# Patient Record
Sex: Female | Born: 1985 | Race: Black or African American | Hispanic: No | Marital: Single | Smoking: Current some day smoker
Health system: Southern US, Community
[De-identification: ages and names within clinical notes are randomized; demographics above are authoritative.]

## PROBLEM LIST (undated history)

## (undated) DIAGNOSIS — J020 Streptococcal pharyngitis: Secondary | ICD-10-CM

## (undated) DIAGNOSIS — M722 Plantar fascial fibromatosis: Secondary | ICD-10-CM

## (undated) HISTORY — PX: TUBAL LIGATION: SHX77

## (undated) HISTORY — PX: HERNIA REPAIR: SHX51

---

## 2008-10-12 ENCOUNTER — Inpatient Hospital Stay (HOSPITAL_COMMUNITY): Admission: AD | Admit: 2008-10-12 | Discharge: 2008-10-13 | Payer: Self-pay | Admitting: Obstetrics & Gynecology

## 2010-07-12 LAB — CBC
MCHC: 34.3 g/dL (ref 30.0–36.0)
Platelets: 246 10*3/uL (ref 150–400)
RBC: 4.01 MIL/uL (ref 3.87–5.11)
WBC: 12.4 10*3/uL — ABNORMAL HIGH (ref 4.0–10.5)

## 2010-07-12 LAB — URINALYSIS, ROUTINE W REFLEX MICROSCOPIC
Glucose, UA: NEGATIVE mg/dL
Nitrite: NEGATIVE
Protein, ur: NEGATIVE mg/dL

## 2012-05-03 ENCOUNTER — Encounter (HOSPITAL_BASED_OUTPATIENT_CLINIC_OR_DEPARTMENT_OTHER): Payer: Self-pay | Admitting: *Deleted

## 2012-05-03 ENCOUNTER — Emergency Department (HOSPITAL_BASED_OUTPATIENT_CLINIC_OR_DEPARTMENT_OTHER)
Admission: EM | Admit: 2012-05-03 | Discharge: 2012-05-03 | Disposition: A | Discharge status: Home / Self Care | Payer: BC Managed Care – PPO | Attending: Emergency Medicine | Admitting: Emergency Medicine

## 2012-05-03 DIAGNOSIS — O210 Mild hyperemesis gravidarum: Secondary | ICD-10-CM | POA: Insufficient documentation

## 2012-05-03 DIAGNOSIS — O219 Vomiting of pregnancy, unspecified: Secondary | ICD-10-CM

## 2012-05-03 DIAGNOSIS — O9989 Other specified diseases and conditions complicating pregnancy, childbirth and the puerperium: Secondary | ICD-10-CM | POA: Insufficient documentation

## 2012-05-03 DIAGNOSIS — R110 Nausea: Secondary | ICD-10-CM | POA: Insufficient documentation

## 2012-05-03 LAB — CBC WITH DIFFERENTIAL/PLATELET
Basophils Absolute: 0 10*3/uL (ref 0.0–0.1)
Basophils Relative: 0 % (ref 0–1)
HCT: 36.6 % (ref 36.0–46.0)
Lymphocytes Relative: 17 % (ref 12–46)
Monocytes Absolute: 0.8 10*3/uL (ref 0.1–1.0)
Neutro Abs: 12.8 10*3/uL — ABNORMAL HIGH (ref 1.7–7.7)
Neutrophils Relative %: 78 % — ABNORMAL HIGH (ref 43–77)
Platelets: 323 10*3/uL (ref 150–400)
RDW: 12.6 % (ref 11.5–15.5)
WBC: 16.5 10*3/uL — ABNORMAL HIGH (ref 4.0–10.5)

## 2012-05-03 LAB — BASIC METABOLIC PANEL
CO2: 25 mEq/L (ref 19–32)
Chloride: 101 mEq/L (ref 96–112)
GFR calc Af Amer: >90 mL/min (ref >90)
Potassium: 3.8 mEq/L (ref 3.5–5.1)
Sodium: 137 mEq/L (ref 135–145)

## 2012-05-03 MED ORDER — SODIUM CHLORIDE 0.9 % IV BOLUS (SEPSIS)
1000.0000 mL | Freq: Once | INTRAVENOUS | Status: AC
  Administered 2012-05-03: 1000 mL via INTRAVENOUS

## 2012-05-03 MED ORDER — ONDANSETRON 4 MG PO TBDP: 4.0000 mg | ORAL_TABLET | Freq: Three times a day (TID) | ORAL | Status: DC | PRN

## 2012-05-03 MED ORDER — ONDANSETRON HCL 4 MG/2ML IJ SOLN
4.0000 mg | Freq: Once | INTRAMUSCULAR | Status: AC
  Administered 2012-05-03: 4 mg via INTRAVENOUS
  Filled 2012-05-03: qty 2

## 2012-05-03 NOTE — ED Provider Notes (Signed)
History     CSN: [id number]  Arrival date & time 05/03/12  1910   First MD Initiated Contact with Patient 05/03/12 1926      Chief Complaint  Patient presents with  . Emesis During Pregnancy    (Consider location/radiation/quality/duration/timing/severity/associated sxs/prior treatment) HPI Comments: Patient is a 27 year old female who presents after 6 episodes of vomiting that occurred today. Patient reports being [redacted] weeks pregnant and having gradual onset of nausea and vomiting this morning. Symptoms persisted throughout the day without improvement. Patient reports taking azithromycin for recent diagnosis of pneumonia. Patient thinks the antibiotics made her sick. No aggravating/alleviating factors. No associated symptoms.    History reviewed. No pertinent past medical history.  Past Surgical History  Procedure Date  . Cesarean section     History reviewed. No pertinent family history.  History  Substance Use Topics  . Smoking status: Never Smoker   . Smokeless tobacco: Not on file  . Alcohol Use: No    OB History    Grav Para Term Preterm Abortions TAB SAB Ect Mult Living                  Review of Systems  Gastrointestinal: Positive for nausea and vomiting.  All other systems reviewed and are negative.    Allergies  Review of patient's allergies indicates no known allergies.  Home Medications   Current Outpatient Rx  Name  Route  Sig  Dispense  Refill  . AZITHROMYCIN 250 MG PO TABS   Oral   Take 250 mg by mouth daily.         Marland Kitchen METRONIDAZOLE 500 MG PO TABS   Oral   Take 500 mg by mouth 3 (three) times daily.           BP 134/84  Pulse 100  Temp 98.2 F (36.8 C) (Oral)  Resp 16  Ht 5\' 4"  (1.626 m)  Wt 200 lb (90.719 kg)  BMI 34.33 kg/m2  SpO2 100%  LMP 03/10/2012  Physical Exam  Nursing note and vitals reviewed. Constitutional: She is oriented to person, place, and time. She appears well-developed and well-nourished. No distress.   HENT:  Head: Normocephalic and atraumatic.  Eyes: Conjunctivae normal and EOM are normal.  Neck: Normal range of motion.  Cardiovascular: Normal rate and regular rhythm.  Exam reveals no gallop and no friction rub.   No murmur heard. Pulmonary/Chest: Effort normal and breath sounds normal. She has no wheezes. She has no rales. She exhibits no tenderness.  Abdominal: Soft. There is no tenderness.  Musculoskeletal: Normal range of motion.  Neurological: She is alert and oriented to person, place, and time. Coordination normal.       Speech is goal-oriented. Moves limbs without ataxia.   Skin: Skin is warm and dry.  Psychiatric: She has a normal mood and affect. Her behavior is normal.    ED Course  Procedures (including critical care time)  Labs Reviewed  CBC WITH DIFFERENTIAL - Abnormal; Notable for the following:    WBC 16.5 (*)     Neutrophils Relative 78 (*)     Neutro Abs 12.8 (*)     All other components within normal limits  BASIC METABOLIC PANEL - Abnormal; Notable for the following:    Glucose, Bld 124 (*)     All other components within normal limits   No results found.   1. Nausea and vomiting in pregnancy       MDM  9:09 PM Patient  given fluids and iv zofran. Patient reports feeling better. Labs unremarkable. Patient will have crackers and juice. If she tolerates PO patient can be discharged without further evaluation.   9:29 PM Patient feeling better and can be discharged with zofran for symptoms. Patient instructed to return with worsening or concerning symptoms.       Emilia Beck, [place] 05/03/12 2146

## 2012-05-03 NOTE — ED Notes (Signed)
Pt c/o vomiting x 1 day pt reports 6 weeks preg, seen by [facility] ed x 2 days ago dx pneumonia , vomiting started after taking z pack

## 2012-05-03 NOTE — ED Notes (Signed)
Pt c/o vomiting after starting on flagyl. Pt states the sxs started today, denies any diarrhea or abd pain.

## 2012-05-03 NOTE — ED Provider Notes (Signed)
Medical screening examination/treatment/procedure(s) were performed by non-physician practitioner and as supervising physician I was immediately available for consultation/collaboration.   Charles B. Bernette Mayers, MD 05/03/12 [phone]

## 2012-09-26 DIAGNOSIS — M5412 Radiculopathy, cervical region: Secondary | ICD-10-CM | POA: Insufficient documentation

## 2012-09-26 DIAGNOSIS — Z79899 Other long term (current) drug therapy: Secondary | ICD-10-CM | POA: Insufficient documentation

## 2012-09-26 DIAGNOSIS — Z792 Long term (current) use of antibiotics: Secondary | ICD-10-CM | POA: Insufficient documentation

## 2012-09-26 DIAGNOSIS — Z3202 Encounter for pregnancy test, result negative: Secondary | ICD-10-CM | POA: Insufficient documentation

## 2012-09-27 ENCOUNTER — Emergency Department (HOSPITAL_BASED_OUTPATIENT_CLINIC_OR_DEPARTMENT_OTHER)
Admission: EM | Admit: 2012-09-27 | Discharge: 2012-09-27 | Disposition: A | Discharge status: Home / Self Care | Payer: BC Managed Care – PPO | Attending: Emergency Medicine | Admitting: Emergency Medicine

## 2012-09-27 ENCOUNTER — Emergency Department (HOSPITAL_BASED_OUTPATIENT_CLINIC_OR_DEPARTMENT_OTHER): Payer: BC Managed Care – PPO

## 2012-09-27 ENCOUNTER — Encounter (HOSPITAL_BASED_OUTPATIENT_CLINIC_OR_DEPARTMENT_OTHER): Payer: Self-pay | Admitting: *Deleted

## 2012-09-27 DIAGNOSIS — M5412 Radiculopathy, cervical region: Secondary | ICD-10-CM

## 2012-09-27 LAB — PREGNANCY, URINE: Preg Test, Ur: NEGATIVE

## 2012-09-27 MED ORDER — IBUPROFEN 600 MG PO TABS: 600.0000 mg | ORAL_TABLET | Freq: Four times a day (QID) | ORAL | Status: DC | PRN

## 2012-09-27 NOTE — ED Notes (Signed)
Pt c/o left side numbness for a couple of weeks. Tonight her left arm became more numb than usual and she was unable to drive.

## 2012-09-27 NOTE — ED Provider Notes (Signed)
History    CSN: [id number] Arrival date & time 09/26/12  2357  First MD Initiated Contact with Patient 09/27/12 0021     Chief Complaint  Patient presents with  . Numbness   (Consider location/radiation/quality/duration/timing/severity/associated sxs/prior Treatment) The history is provided by the patient. No language interpreter was used.  When driving or going repetitive activity or dangling the left arm it occasionally tingles no weakness.  No HA no changes in vision nor speech.  Occasionally has neck pain with cracking it.  No f/c/r.  No rashes.   History reviewed. No pertinent past medical history. Past Surgical History  Procedure Laterality Date  . Cesarean section     No family history on file. History  Substance Use Topics  . Smoking status: Never Smoker   . Smokeless tobacco: Not on file  . Alcohol Use: No   OB History   Grav Para Term Preterm Abortions TAB SAB Ect Mult Living                 Review of Systems  Constitutional: Negative for fever.  Respiratory: Negative for cough and shortness of breath.   Cardiovascular: Negative for chest pain.  Neurological: Negative for dizziness, seizures, syncope, facial asymmetry, speech difficulty, weakness, light-headedness and headaches.  All other systems reviewed and are negative.    Allergies  Review of patient's allergies indicates no known allergies.  Home Medications   Current Outpatient Rx  Name  Route  Sig  Dispense  Refill  . azithromycin (ZITHROMAX) 250 MG tablet   Oral   Take 250 mg by mouth daily.         . metroNIDAZOLE (FLAGYL) 500 MG tablet   Oral   Take 500 mg by mouth 3 (three) times daily.         . ondansetron (ZOFRAN ODT) 4 MG disintegrating tablet   Oral   Take 1 tablet (4 mg total) by mouth every 8 (eight) hours as needed for nausea.   10 tablet   0    BP 136/90  Pulse 94  Temp(Src) 98 F (36.7 C) (Oral)  Resp 18  Ht 5\' 5"  (1.651 m)  Wt 220 lb (99.791 kg)  BMI 36.61  kg/m2  SpO2 100%  LMP 09/20/2012 Physical Exam  Constitutional: She is oriented to person, place, and time. She appears well-developed and well-nourished. No distress.  HENT:  Head: Normocephalic and atraumatic.  Mouth/Throat: Oropharynx is clear and moist.  Eyes: Conjunctivae are normal. Pupils are equal, round, and reactive to light.  Neck: Normal range of motion. Neck supple. No tracheal deviation present.  Cardiovascular: Normal rate, regular rhythm and intact distal pulses.   Pulmonary/Chest: Effort normal and breath sounds normal. No stridor. She has no wheezes. She has no rales.  Abdominal: Soft. Bowel sounds are normal. There is no tenderness. There is no rebound and no guarding.  Musculoskeletal: Normal range of motion.  Lymphadenopathy:    She has no cervical adenopathy.  Neurological: She is alert and oriented to person, place, and time. She has normal reflexes. She displays normal reflexes. No cranial nerve deficit. She exhibits normal muscle tone. Coordination normal.  5/5 strength B, sensation intact B in both UE.    Skin: Skin is warm and dry.  Psychiatric: She has a normal mood and affect.    ED Course  Procedures (including critical care time) Labs Reviewed  PREGNANCY, URINE   Dg Cervical Spine Complete  09/27/2012   *RADIOLOGY REPORT*  Clinical Data: Left  neck pain.  Left arm numbness  CERVICAL SPINE - 4+ VIEWS  Comparison:  None.  Findings:  There is no evidence of cervical spine fracture or prevertebral soft tissue swelling.  Alignment is normal.  No other significant bone abnormalities are identified.  IMPRESSION: Negative cervical spine radiographs.   Original Report Authenticated By: Janeece Riggers, M.D.   No diagnosis found.  MDM  Radiculopathy.  Will rtefer to neurology for ongoing care.  Will prescribe NSAIDs and Elelvations  Derika Eckles K Herny Scurlock-Rasch, MD 09/27/12 [phone]

## 2012-09-27 NOTE — ED Notes (Signed)
Pt. Reports she has had cramping today and feels like her arm is so tired she cant use it from the elbow to the fingers.   Pt. Able to move all fingers on L hand and arm and feels touch and sensation.

## 2012-10-16 ENCOUNTER — Ambulatory Visit: Payer: Self-pay | Admitting: Neurology

## 2012-11-03 ENCOUNTER — Encounter (HOSPITAL_BASED_OUTPATIENT_CLINIC_OR_DEPARTMENT_OTHER): Payer: Self-pay

## 2012-11-03 ENCOUNTER — Emergency Department (HOSPITAL_BASED_OUTPATIENT_CLINIC_OR_DEPARTMENT_OTHER): Payer: BC Managed Care – PPO

## 2012-11-03 ENCOUNTER — Emergency Department (HOSPITAL_BASED_OUTPATIENT_CLINIC_OR_DEPARTMENT_OTHER)
Admission: EM | Admit: 2012-11-03 | Discharge: 2012-11-03 | Disposition: A | Discharge status: Home / Self Care | Payer: BC Managed Care – PPO | Attending: Emergency Medicine | Admitting: Emergency Medicine

## 2012-11-03 DIAGNOSIS — Z3490 Encounter for supervision of normal pregnancy, unspecified, unspecified trimester: Secondary | ICD-10-CM

## 2012-11-03 DIAGNOSIS — R109 Unspecified abdominal pain: Secondary | ICD-10-CM | POA: Insufficient documentation

## 2012-11-03 DIAGNOSIS — O9989 Other specified diseases and conditions complicating pregnancy, childbirth and the puerperium: Secondary | ICD-10-CM | POA: Insufficient documentation

## 2012-11-03 DIAGNOSIS — Z87891 Personal history of nicotine dependence: Secondary | ICD-10-CM | POA: Insufficient documentation

## 2012-11-03 DIAGNOSIS — Z8719 Personal history of other diseases of the digestive system: Secondary | ICD-10-CM | POA: Insufficient documentation

## 2012-11-03 DIAGNOSIS — K2970 Gastritis, unspecified, without bleeding: Secondary | ICD-10-CM | POA: Insufficient documentation

## 2012-11-03 DIAGNOSIS — O169 Unspecified maternal hypertension, unspecified trimester: Secondary | ICD-10-CM | POA: Insufficient documentation

## 2012-11-03 LAB — COMPREHENSIVE METABOLIC PANEL
ALT: 16 U/L (ref 0–35)
AST: 14 U/L (ref 0–37)
Alkaline Phosphatase: 48 U/L (ref 39–117)
Calcium: 9.5 mg/dL (ref 8.4–10.5)
GFR calc Af Amer: >90 mL/min (ref >90)
Glucose, Bld: 94 mg/dL (ref 70–99)
Potassium: 3.5 mEq/L (ref 3.5–5.1)
Sodium: 136 mEq/L (ref 135–145)
Total Protein: 7.3 g/dL (ref 6.0–8.3)

## 2012-11-03 LAB — URINALYSIS, ROUTINE W REFLEX MICROSCOPIC
Leukocytes, UA: NEGATIVE
Nitrite: NEGATIVE
Specific Gravity, Urine: 1.028 (ref 1.005–1.030)
Urobilinogen, UA: 0.2 mg/dL (ref 0.0–1.0)
pH: 5.5 (ref 5.0–8.0)

## 2012-11-03 LAB — CBC WITH DIFFERENTIAL/PLATELET
Basophils Absolute: 0 10*3/uL (ref 0.0–0.1)
Eosinophils Absolute: 0.2 10*3/uL (ref 0.0–0.7)
Eosinophils Relative: 2 % (ref 0–5)
Lymphocytes Relative: 30 % (ref 12–46)
Lymphs Abs: 2.7 10*3/uL (ref 0.7–4.0)
MCH: 29.9 pg (ref 26.0–34.0)
Neutrophils Relative %: 56 % (ref 43–77)
Platelets: 252 10*3/uL (ref 150–400)
RBC: 4.22 MIL/uL (ref 3.87–5.11)
RDW: 12.5 % (ref 11.5–15.5)
WBC: 9 10*3/uL (ref 4.0–10.5)

## 2012-11-03 NOTE — ED Notes (Addendum)
Pt states that she is having LUQ pain starting around 2am this morning.  She states she has had similar pain in the past which was related to her gallbladder being "inflamed."  Pt also reports that she may be pregnant.  Pt in NAD, AAOx4.

## 2012-11-03 NOTE — ED Provider Notes (Signed)
CSN: [id number]     Arrival date & time 11/03/12  1030 History     First MD Initiated Contact with Patient 11/03/12 1112     Chief Complaint  Patient presents with  . Abdominal Pain   (Consider location/radiation/quality/duration/timing/severity/associated sxs/prior Treatment) HPI Comments: Patient presents with epigastric discomfort. She states she was diagnosed with gallstones in the mid to [facility] about a year ago. She did not followup with anyone regarding the possible gallstones. She complains of low one-day history of pressure discomfort in her epigastric area and across her upper abdomen. She's had some nausea but no vomiting. She denies he fevers or chills. She had increased bloating when she eats that the pain did not intensify when she. She currently thinks that she is about 7-[redacted] weeks pregnant. Her last menstrual cycle was in mid June. She denies any lower abdominal pain vaginal bleeding or discharge. She has an upcoming appointment to followup with OB/GYN.  Patient is a 27 y.o. female presenting with abdominal pain.  Abdominal Pain Associated symptoms include abdominal pain. Pertinent negatives include no chest pain, no headaches and no shortness of breath.    Past Medical History  Diagnosis Date  . Hypertension    Past Surgical History  Procedure Laterality Date  . Cesarean section     History reviewed. No pertinent family history. History  Substance Use Topics  . Smoking status: Former Games developer  . Smokeless tobacco: Not on file  . Alcohol Use: No   OB History   Grav Para Term Preterm Abortions TAB SAB Ect Mult Living                 Review of Systems  Constitutional: Negative for fever, chills, diaphoresis and fatigue.  HENT: Negative for congestion, rhinorrhea and sneezing.   Eyes: Negative.   Respiratory: Negative for cough, chest tightness and shortness of breath.   Cardiovascular: Negative for chest pain and leg swelling.   Gastrointestinal: Positive for nausea and abdominal pain. Negative for vomiting, diarrhea and blood in stool.  Genitourinary: Negative for frequency, hematuria, flank pain and difficulty urinating.  Musculoskeletal: Negative for back pain and arthralgias.  Skin: Negative for rash.  Neurological: Negative for dizziness, speech difficulty, weakness, numbness and headaches.    Allergies  Review of patient's allergies indicates no known allergies.  Home Medications   Current Outpatient Rx  Name  Route  Sig  Dispense  Refill  . azithromycin (ZITHROMAX) 250 MG tablet   Oral   Take 250 mg by mouth daily.         Marland Kitchen ibuprofen (ADVIL,MOTRIN) 600 MG tablet   Oral   Take 1 tablet (600 mg total) by mouth every 6 (six) hours as needed for pain.   30 tablet   0   . metroNIDAZOLE (FLAGYL) 500 MG tablet   Oral   Take 500 mg by mouth 3 (three) times daily.         . ondansetron (ZOFRAN ODT) 4 MG disintegrating tablet   Oral   Take 1 tablet (4 mg total) by mouth every 8 (eight) hours as needed for nausea.   10 tablet   0    BP 122/73  Pulse 83  Temp(Src) 98 F (36.7 C) (Oral)  Resp 18  SpO2 100%  LMP 09/13/2012 Physical Exam  Constitutional: She is oriented to person, place, and time. She appears well-developed and well-nourished.  HENT:  Head: Normocephalic and atraumatic.  Eyes: Pupils are equal, round, and reactive to  light.  Neck: Normal range of motion. Neck supple.  Cardiovascular: Normal rate, regular rhythm and normal heart sounds.   Pulmonary/Chest: Effort normal and breath sounds normal. No respiratory distress. She has no wheezes. She has no rales. She exhibits no tenderness.  Abdominal: Soft. Bowel sounds are normal. There is tenderness (mild tenderness across upper abdomen. There is no pain in the lower abdomen or pelvic region.). There is no rebound and no guarding.  Musculoskeletal: Normal range of motion. She exhibits no edema.  Lymphadenopathy:    She has no  cervical adenopathy.  Neurological: She is alert and oriented to person, place, and time.  Skin: Skin is warm and dry. No rash noted.  Psychiatric: She has a normal mood and affect.    ED Course   Procedures (including critical care time)  Results for orders placed during the hospital encounter of 11/03/12  URINALYSIS, ROUTINE W REFLEX MICROSCOPIC      Result Value Range   Color, Urine YELLOW  YELLOW   APPearance CLEAR  CLEAR   Specific Gravity, Urine 1.028  1.005 - 1.030   pH 5.5  5.0 - 8.0   Glucose, UA NEGATIVE  NEGATIVE mg/dL   Hgb urine dipstick NEGATIVE  NEGATIVE   Bilirubin Urine NEGATIVE  NEGATIVE   Ketones, ur 15 (*) NEGATIVE mg/dL   Protein, ur NEGATIVE  NEGATIVE mg/dL   Urobilinogen, UA 0.2  0.0 - 1.0 mg/dL   Nitrite NEGATIVE  NEGATIVE   Leukocytes, UA NEGATIVE  NEGATIVE  PREGNANCY, URINE      Result Value Range   Preg Test, Ur POSITIVE (*) NEGATIVE  CBC WITH DIFFERENTIAL      Result Value Range   WBC 9.0  4.0 - 10.5 K/uL   RBC 4.22  3.87 - 5.11 MIL/uL   Hemoglobin 12.6  12.0 - 15.0 g/dL   HCT 86.5  78.4 - 69.6 %   MCV 87.4  78.0 - 100.0 fL   MCH 29.9  26.0 - 34.0 pg   MCHC 34.1  30.0 - 36.0 g/dL   RDW 29.5  28.4 - 13.2 %   Platelets 252  150 - 400 K/uL   Neutrophils Relative % 56  43 - 77 %   Neutro Abs 5.1  1.7 - 7.7 K/uL   Lymphocytes Relative 30  12 - 46 %   Lymphs Abs 2.7  0.7 - 4.0 K/uL   Monocytes Relative 11  3 - 12 %   Monocytes Absolute 1.0  0.1 - 1.0 K/uL   Eosinophils Relative 2  0 - 5 %   Eosinophils Absolute 0.2  0.0 - 0.7 K/uL   Basophils Relative 0  0 - 1 %   Basophils Absolute 0.0  0.0 - 0.1 K/uL  COMPREHENSIVE METABOLIC PANEL      Result Value Range   Sodium 136  135 - 145 mEq/L   Potassium 3.5  3.5 - 5.1 mEq/L   Chloride 101  96 - 112 mEq/L   CO2 25  19 - 32 mEq/L   Glucose, Bld 94  70 - 99 mg/dL   BUN 9  6 - 23 mg/dL   Creatinine, Ser 4.40  0.50 - 1.10 mg/dL   Calcium 9.5  8.4 - 10.2 mg/dL   Total Protein 7.3  6.0 - 8.3 g/dL    Albumin 3.6  3.5 - 5.2 g/dL   AST 14  0 - 37 U/L   ALT 16  0 - 35 U/L   Alkaline Phosphatase 48  39 - 117 U/L   Total Bilirubin 0.2 (*) 0.3 - 1.2 mg/dL   GFR calc non Af Amer >90  >90 mL/min   GFR calc Af Amer >90  >90 mL/min  LIPASE, BLOOD      Result Value Range   Lipase 17  11 - 59 U/L   US Abdomen Complete  11/03/2012   *RADIOLOGY REPORT*  Clinical Data:  Upper abdominal pain  COMPLETE ABDOMINAL ULTRASOUND  Comparison:  Right upper quadrant ultrasound 09/06/2008  Findings:  Gallbladder:  No gallstones, gallbladder wall thickening, or pericholecystic fluid.  Common bile duct:  Measures 3 mm  Liver:  No focal lesion identified.  Within normal limits in parenchymal echogenicity.  IVC:  Appears normal.  Pancreas:  No focal abnormality seen.  Spleen:  Measures 6.2 cm  Right Kidney:  Measures 12.4 cm.  Normal renal cortical thickness and echogenicity.  No hydronephrosis.  Left Kidney:  Measures 12.6 cm.  Normal renal cortical thickness and echogenicity.  No hydronephrosis.  Abdominal aorta:  The proximal abdominal aorta is visualized and measures 1.9 cm.  The mid and distal aspect of the abdominal aorta is not visualized due to overlying shadowing bowel gas.  IMPRESSION:  No cholelithiasis or sonographic evidence for acute cholecystitis.   Original Report Authenticated By: Annia Belt, M.D   US Ob Transvaginal  11/03/2012   *RADIOLOGY REPORT*  Clinical Data: Uncertain dates  OBSTETRIC <14 WK [place] AND TRANSVAGINAL OB US  Technique:  Both transabdominal and transvaginal ultrasound examinations were performed for complete evaluation of the gestation as well as the maternal uterus, adnexal regions, and pelvic cul-de-sac.  Transvaginal technique was performed to assess early pregnancy.  Comparison:  None.  Intrauterine gestational sac:  Visualized/normal in shape. Yolk sac: Seen Embryo: Seen Cardiac Activity: Seen Heart Rate: 125 bpm  CRL: 5.0  mm  6 w  2 d        [place] EDC: 06/27/2013  Maternal uterus/adnexae:  Small old subchorionic hemorrhage is seen.  The right ovary is not visualized.  The left ovary has a normal appearance measuring 1.7 x 1.9 x 1.8 cm.  No pelvic fluid is identified appear  IMPRESSION: Single living intrauterine pregnancy demonstrating an estimated gestational age by crown-rump length of 6 weeks 2 days [facility] 06/27/2013).  This is 1 week behind expected estimated gestational age by LMP of 7 weeks 2 days and suggests best dating by today's exam.  Normal left ovary and non-visualized right ovary   Original Report Authenticated By: Rhodia Albright, M.D.      No results found.    1. Gastritis   2. Pregnancy     MDM  Patient currently has no significant tenderness on exam. There is no evidence of cholecystitis or gallstones. There is no evidence of pancreatitis. She was advised to use a bland diet and to use condoms for possible gastritis. OB ultrasound shoes the positive viable pregnancy with a heart rate of 125. I did advise the patient of the small subchorionic hemorrhage. She has an appointment to followup with her OB/GYN on August 21. I advised her followup sooner if she has any ongoing symptoms or vaginal bleeding.  Rolan Bucco, MD 11/03/12 [phone]

## 2014-01-01 ENCOUNTER — Emergency Department (HOSPITAL_BASED_OUTPATIENT_CLINIC_OR_DEPARTMENT_OTHER)
Admission: EM | Admit: 2014-01-01 | Discharge: 2014-01-01 | Disposition: A | Discharge status: Home / Self Care | Payer: BC Managed Care – PPO | Attending: Emergency Medicine | Admitting: Emergency Medicine

## 2014-01-01 ENCOUNTER — Encounter (HOSPITAL_BASED_OUTPATIENT_CLINIC_OR_DEPARTMENT_OTHER): Payer: Self-pay | Admitting: Emergency Medicine

## 2014-01-01 DIAGNOSIS — Z792 Long term (current) use of antibiotics: Secondary | ICD-10-CM | POA: Insufficient documentation

## 2014-01-01 DIAGNOSIS — IMO0001 Reserved for inherently not codable concepts without codable children: Secondary | ICD-10-CM | POA: Diagnosis not present

## 2014-01-01 DIAGNOSIS — R0982 Postnasal drip: Secondary | ICD-10-CM | POA: Diagnosis not present

## 2014-01-01 DIAGNOSIS — J069 Acute upper respiratory infection, unspecified: Secondary | ICD-10-CM | POA: Insufficient documentation

## 2014-01-01 DIAGNOSIS — I10 Essential (primary) hypertension: Secondary | ICD-10-CM | POA: Diagnosis not present

## 2014-01-01 DIAGNOSIS — H9209 Otalgia, unspecified ear: Secondary | ICD-10-CM | POA: Insufficient documentation

## 2014-01-01 DIAGNOSIS — Z79899 Other long term (current) drug therapy: Secondary | ICD-10-CM | POA: Diagnosis not present

## 2014-01-01 DIAGNOSIS — F17200 Nicotine dependence, unspecified, uncomplicated: Secondary | ICD-10-CM | POA: Diagnosis not present

## 2014-01-01 NOTE — ED Notes (Signed)
C/o left ear ache,chills, body aches, sinus pressure.Coughing yellow mucus. Has not run a fever at home.

## 2014-01-01 NOTE — ED Provider Notes (Signed)
CSN: [id number]     Arrival date & time 01/01/14  [id number] History   First MD Initiated Contact with Patient 01/01/14 1019     Chief Complaint  Patient presents with  . Otalgia     (Consider location/radiation/quality/duration/timing/severity/associated sxs/prior Treatment) HPI Comments: Patient presents with left-sided earache. She's had some runny nose and congestion for about 2-3 days. She's had a one-day history of pain in her left ear. She feels achy all over. She denies any fevers or chills. She has a cough with some mucus production but she feels it is coming from the back of her throat rather than her chest. She denies any shortness of breath or chest congestion. She denies he nausea or vomiting. She's been using Mucinex with no relief in symptoms.  Patient is a 28 y.o. female presenting with ear pain.  Otalgia Associated symptoms: congestion, cough and rhinorrhea   Associated symptoms: no abdominal pain, no diarrhea, no fever, no headaches, no rash and no vomiting     Past Medical History  Diagnosis Date  . Hypertension    Past Surgical History  Procedure Laterality Date  . Cesarean section     No family history on file. History  Substance Use Topics  . Smoking status: Current Every Day Smoker -- 0.20 packs/day    Types: Cigarettes  . Smokeless tobacco: Not on file  . Alcohol Use: No   OB History   Grav Para Term Preterm Abortions TAB SAB Ect Mult Living                 Review of Systems  Constitutional: Negative for fever, chills, diaphoresis and fatigue.  HENT: Positive for congestion, ear pain, postnasal drip and rhinorrhea. Negative for sneezing.   Eyes: Negative.   Respiratory: Positive for cough. Negative for chest tightness and shortness of breath.   Cardiovascular: Negative for chest pain and leg swelling.  Gastrointestinal: Negative for nausea, vomiting, abdominal pain, diarrhea and blood in stool.  Genitourinary: Negative for frequency, hematuria, flank  pain and difficulty urinating.  Musculoskeletal: Positive for myalgias. Negative for arthralgias and back pain.  Skin: Negative for rash.  Neurological: Negative for dizziness, speech difficulty, weakness, numbness and headaches.      Allergies  Review of patient's allergies indicates no known allergies.  Home Medications   Prior to Admission medications   Medication Sig Start Date End Date Taking? Authorizing Provider  azithromycin (ZITHROMAX) 250 MG tablet Take 250 mg by mouth daily.    Historical Provider, MD  ibuprofen (ADVIL,MOTRIN) 600 MG tablet Take 1 tablet (600 mg total) by mouth every 6 (six) hours as needed for pain. 09/27/12   April K Palumbo-Rasch, MD  metroNIDAZOLE (FLAGYL) 500 MG tablet Take 500 mg by mouth 3 (three) times daily.    Historical Provider, MD  ondansetron (ZOFRAN ODT) 4 MG disintegrating tablet Take 1 tablet (4 mg total) by mouth every 8 (eight) hours as needed for nausea. 05/03/12   Kaitlyn Szekalski, PA-C   BP 125/82  Pulse 99  Temp(Src) 98.6 F (37 C) (Oral)  Resp 20  Ht 5\' 5"  (1.651 m)  Wt 230 lb (104.327 kg)  BMI 38.27 kg/m2  SpO2 99%  LMP 12/28/2013 Physical Exam  Constitutional: She is oriented to person, place, and time. She appears well-developed and well-nourished.  HENT:  Head: Normocephalic and atraumatic.  Right Ear: External ear normal.  Left Ear: External ear normal.  Mouth/Throat: Oropharynx is clear and moist.  Eyes: Pupils are equal, round, and reactive  to light.  Neck: Normal range of motion. Neck supple.  Cardiovascular: Normal rate, regular rhythm and normal heart sounds.   Pulmonary/Chest: Effort normal and breath sounds normal. No respiratory distress. She has no wheezes. She has no rales. She exhibits no tenderness.  Abdominal: Soft. Bowel sounds are normal. There is no tenderness. There is no rebound and no guarding.  Musculoskeletal: Normal range of motion. She exhibits no edema.  Lymphadenopathy:    She has no cervical  adenopathy.  Neurological: She is alert and oriented to person, place, and time.  Skin: Skin is warm and dry. No rash noted.  Psychiatric: She has a normal mood and affect.    ED Course  Procedures (including critical care time) Labs Review Labs Reviewed - No data to display  Imaging Review No results found.   EKG Interpretation None      MDM   Final diagnoses:  URI (upper respiratory infection)    There is no evidence of otitis media. Feel her symptoms are likely resulting from the URI. Her lungs are clear without suggestions of pneumonia. She was given a prescription for Allegra-D which was handwritten. She was discharged home in good condition and encouraged to followup with her primary care physician or return here as needed for any worsening symptoms.    Rolan Bucco, MD 01/01/14 1201

## 2014-01-01 NOTE — Discharge Instructions (Signed)
Upper Respiratory Infection, Adult An upper respiratory infection (URI) is also sometimes known as the common cold. The upper respiratory tract includes the nose, sinuses, throat, trachea, and bronchi. Bronchi are the airways leading to the lungs. Most people improve within 1 week, but symptoms can last up to 2 weeks. A residual cough may last even longer.  CAUSES Many different viruses can infect the tissues lining the upper respiratory tract. The tissues become irritated and inflamed and often become very moist. Mucus production is also common. A cold is contagious. You can easily spread the virus to others by oral contact. This includes kissing, sharing a glass, coughing, or sneezing. Touching your mouth or nose and then touching a surface, which is then touched by another person, can also spread the virus. SYMPTOMS  Symptoms typically develop 1 to 3 days after you come in contact with a cold virus. Symptoms vary from person to person. They may include:  Runny nose.  Sneezing.  Nasal congestion.  Sinus irritation.  Sore throat.  Loss of voice (laryngitis).  Cough.  Fatigue.  Muscle aches.  Loss of appetite.  Headache.  Low-grade fever. DIAGNOSIS  You might diagnose your own cold based on familiar symptoms, since most people get a cold 2 to 3 times a year. Your caregiver can confirm this based on your exam. Most importantly, your caregiver can check that your symptoms are not due to another disease such as strep throat, sinusitis, pneumonia, asthma, or epiglottitis. Blood tests, throat tests, and X-rays are not necessary to diagnose a common cold, but they may sometimes be helpful in excluding other more serious diseases. Your caregiver will decide if any further tests are required. RISKS AND COMPLICATIONS  You may be at risk for a more severe case of the common cold if you smoke cigarettes, have chronic heart disease (such as heart failure) or lung disease (such as asthma), or if  you have a weakened immune system. The very young and very old are also at risk for more serious infections. Bacterial sinusitis, middle ear infections, and bacterial pneumonia can complicate the common cold. The common cold can worsen asthma and chronic obstructive pulmonary disease (COPD). Sometimes, these complications can require emergency medical care and may be life-threatening. PREVENTION  The best way to protect against getting a cold is to practice good hygiene. Avoid oral or hand contact with people with cold symptoms. Wash your hands often if contact occurs. There is no clear evidence that vitamin C, vitamin E, echinacea, or exercise reduces the chance of developing a cold. However, it is always recommended to get plenty of rest and practice good nutrition. TREATMENT  Treatment is directed at relieving symptoms. There is no cure. Antibiotics are not effective, because the infection is caused by a virus, not by bacteria. Treatment may include:  Increased fluid intake. Sports drinks offer valuable electrolytes, sugars, and fluids.  Breathing heated mist or steam (vaporizer or shower).  Eating chicken soup or other clear broths, and maintaining good nutrition.  Getting plenty of rest.  Using gargles or lozenges for comfort.  Controlling fevers with ibuprofen or acetaminophen as directed by your caregiver.  Increasing usage of your inhaler if you have asthma. Zinc gel and zinc lozenges, taken in the first 24 hours of the common cold, can shorten the duration and lessen the severity of symptoms. Pain medicines may help with fever, muscle aches, and throat pain. A variety of non-prescription medicines are available to treat congestion and runny nose. Your caregiver   can make recommendations and may suggest nasal or lung inhalers for other symptoms.  HOME CARE INSTRUCTIONS   Only take over-the-counter or prescription medicines for pain, discomfort, or fever as directed by your  caregiver.  Use a warm mist humidifier or inhale steam from a shower to increase air moisture. This may keep secretions moist and make it easier to breathe.  Drink enough water and fluids to keep your urine clear or pale yellow.  Rest as needed.  Return to work when your temperature has returned to normal or as your caregiver advises. You may need to stay home longer to avoid infecting others. You can also use a face mask and careful hand washing to prevent spread of the virus. SEEK MEDICAL CARE IF:   After the first few days, you feel you are getting worse rather than better.  You need your caregiver's advice about medicines to control symptoms.  You develop chills, worsening shortness of breath, or brown or red sputum. These may be signs of pneumonia.  You develop yellow or brown nasal discharge or pain in the face, especially when you bend forward. These may be signs of sinusitis.  You develop a fever, swollen neck glands, pain with swallowing, or white areas in the back of your throat. These may be signs of strep throat. SEEK IMMEDIATE MEDICAL CARE IF:   You have a fever.  You develop severe or persistent headache, ear pain, sinus pain, or chest pain.  You develop wheezing, a prolonged cough, cough up blood, or have a change in your usual mucus (if you have chronic lung disease).  You develop sore muscles or a stiff neck. Document Released: 09/15/2000 Document Revised: 06/14/2011 Document Reviewed: 06/27/2013 [employer] Patient Information 2015 [employer]. This information is not intended to replace advice given to you by your health care provider. Make sure you discuss any questions you have with your health care provider.  

## 2014-07-02 ENCOUNTER — Encounter (HOSPITAL_BASED_OUTPATIENT_CLINIC_OR_DEPARTMENT_OTHER): Payer: Self-pay | Admitting: *Deleted

## 2014-07-02 ENCOUNTER — Emergency Department (HOSPITAL_BASED_OUTPATIENT_CLINIC_OR_DEPARTMENT_OTHER)
Admission: EM | Admit: 2014-07-02 | Discharge: 2014-07-02 | Disposition: A | Discharge status: Home / Self Care | Payer: BLUE CROSS/BLUE SHIELD | Attending: Emergency Medicine | Admitting: Emergency Medicine

## 2014-07-02 DIAGNOSIS — R300 Dysuria: Secondary | ICD-10-CM | POA: Insufficient documentation

## 2014-07-02 DIAGNOSIS — Z720 Tobacco use: Secondary | ICD-10-CM | POA: Diagnosis not present

## 2014-07-02 DIAGNOSIS — I10 Essential (primary) hypertension: Secondary | ICD-10-CM | POA: Diagnosis not present

## 2014-07-02 DIAGNOSIS — Z3202 Encounter for pregnancy test, result negative: Secondary | ICD-10-CM | POA: Diagnosis not present

## 2014-07-02 DIAGNOSIS — Z792 Long term (current) use of antibiotics: Secondary | ICD-10-CM | POA: Insufficient documentation

## 2014-07-02 DIAGNOSIS — M545 Low back pain: Secondary | ICD-10-CM | POA: Diagnosis not present

## 2014-07-02 DIAGNOSIS — R1030 Lower abdominal pain, unspecified: Secondary | ICD-10-CM | POA: Diagnosis present

## 2014-07-02 DIAGNOSIS — R102 Pelvic and perineal pain: Secondary | ICD-10-CM

## 2014-07-02 LAB — URINALYSIS, ROUTINE W REFLEX MICROSCOPIC
BILIRUBIN URINE: NEGATIVE
Glucose, UA: NEGATIVE mg/dL
HGB URINE DIPSTICK: NEGATIVE
Ketones, ur: NEGATIVE mg/dL
Leukocytes, UA: NEGATIVE
NITRITE: NEGATIVE
PH: 6 (ref 5.0–8.0)
Protein, ur: NEGATIVE mg/dL
Specific Gravity, Urine: 1.031 — ABNORMAL HIGH (ref 1.005–1.030)
Urobilinogen, UA: 1 mg/dL (ref 0.0–1.0)

## 2014-07-02 LAB — PREGNANCY, URINE: PREG TEST UR: NEGATIVE

## 2014-07-02 LAB — WET PREP, GENITAL
TRICH WET PREP: NONE SEEN
Yeast Wet Prep HPF POC: NONE SEEN

## 2014-07-02 NOTE — ED Notes (Signed)
Abdominal pain. States she has been holding her urine and thinks she may have a UTI.

## 2014-07-02 NOTE — Discharge Instructions (Signed)
Abdominal Pain, Women °Abdominal (stomach, pelvic, or belly) pain can be caused by many things. It is important to tell your doctor: °· The location of the pain. °· Does it come and go or is it present all the time? °· Are there things that start the pain (eating certain foods, exercise)? °· Are there other symptoms associated with the pain (fever, nausea, vomiting, diarrhea)? °All of this is helpful to know when trying to find the cause of the pain. °CAUSES  °· Stomach: virus or bacteria infection, or ulcer. °· Intestine: appendicitis (inflamed appendix), regional ileitis (Crohn's disease), ulcerative colitis (inflamed colon), irritable bowel syndrome, diverticulitis (inflamed diverticulum of the colon), or cancer of the stomach or intestine. °· Gallbladder disease or stones in the gallbladder. °· Kidney disease, kidney stones, or infection. °· Pancreas infection or cancer. °· Fibromyalgia (pain disorder). °· Diseases of the female organs: °¨ Uterus: fibroid (non-cancerous) tumors or infection. °¨ Fallopian tubes: infection or tubal pregnancy. °¨ Ovary: cysts or tumors. °¨ Pelvic adhesions (scar tissue). °¨ Endometriosis (uterus lining tissue growing in the pelvis and on the pelvic organs). °¨ Pelvic congestion syndrome (female organs filling up with blood just before the menstrual period). °¨ Pain with the menstrual period. °¨ Pain with ovulation (producing an egg). °¨ Pain with an IUD (intrauterine device, birth control) in the uterus. °¨ Cancer of the female organs. °· Functional pain (pain not caused by a disease, may improve without treatment). °· Psychological pain. °· Depression. °DIAGNOSIS  °Your doctor will decide the seriousness of your pain by doing an examination. °· Blood tests. °· X-rays. °· Ultrasound. °· CT scan (computed tomography, special type of X-ray). °· MRI (magnetic resonance imaging). °· Cultures, for infection. °· Barium enema (dye inserted in the large intestine, to better view it with  X-rays). °· Colonoscopy (looking in intestine with a lighted tube). °· Laparoscopy (minor surgery, looking in abdomen with a lighted tube). °· Major abdominal exploratory surgery (looking in abdomen with a large incision). °TREATMENT  °The treatment will depend on the cause of the pain.  °· Many cases can be observed and treated at home. °· Over-the-counter medicines recommended by your caregiver. °· Prescription medicine. °· Antibiotics, for infection. °· Birth control pills, for painful periods or for ovulation pain. °· Hormone treatment, for endometriosis. °· Nerve blocking injections. °· Physical therapy. °· Antidepressants. °· Counseling with a psychologist or psychiatrist. °· Minor or major surgery. °HOME CARE INSTRUCTIONS  °· Do not take laxatives, unless directed by your caregiver. °· Take over-the-counter pain medicine only if ordered by your caregiver. Do not take aspirin because it can cause an upset stomach or bleeding. °· Try a clear liquid diet (broth or water) as ordered by your caregiver. Slowly move to a bland diet, as tolerated, if the pain is related to the stomach or intestine. °· Have a thermometer and take your temperature several times a day, and record it. °· Bed rest and sleep, if it helps the pain. °· Avoid sexual intercourse, if it causes pain. °· Avoid stressful situations. °· Keep your follow-up appointments and tests, as your caregiver orders. °· If the pain does not go away with medicine or surgery, you may try: °¨ Acupuncture. °¨ Relaxation exercises (yoga, meditation). °¨ Group therapy. °¨ Counseling. °SEEK MEDICAL CARE IF:  °· You notice certain foods cause stomach pain. °· Your home care treatment is not helping your pain. °· You need stronger pain medicine. °· You want your IUD removed. °· You feel faint or   lightheaded. °· You develop nausea and vomiting. °· You develop a rash. °· You are having side effects or an allergy to your medicine. °SEEK IMMEDIATE MEDICAL CARE IF:  °· Your  pain does not go away or gets worse. °· You have a fever. °· Your pain is felt only in portions of the abdomen. The right side could possibly be appendicitis. The left lower portion of the abdomen could be colitis or diverticulitis. °· You are passing blood in your stools (bright red or black tarry stools, with or without vomiting). °· You have blood in your urine. °· You develop chills, with or without a fever. °· You pass out. °MAKE SURE YOU:  °· Understand these instructions. °· Will watch your condition. °· Will get help right away if you are not doing well or get worse. °Document Released: 01/17/2007 Document Revised: 08/06/2013 Document Reviewed: 02/06/2009 °[employer]® Patient Information ©2015 [employer]. This information is not intended to replace advice given to you by your health care provider. Make sure you discuss any questions you have with your health care provider. ° °

## 2014-07-02 NOTE — ED Notes (Signed)
MD at bedside. 

## 2014-07-02 NOTE — ED Provider Notes (Addendum)
CSN: [id number]     Arrival date & time 07/02/14  1191 History   This chart was scribed for Mary Sprout, MD by Gwenyth Ober, ED Scribe. This patient was seen in room MH08/MH08 and the patient's care was started at 6:55 PM.      Chief Complaint  Patient presents with  . Abdominal Pain    The history is provided by the patient. No language interpreter was used.    HPI Comments: Mary Mills is a 29 y.o. female who presents to the Emergency Department complaining of constant, mild suprapubic pain that started a few days ago. Pt states dysuria, increased urinary frequency, lower back pain and nausea as associated symptoms. She took an OTC urinary pill with no relief. Pt denies vaginal discharge, vaginal bleeding, vomiting and fever as associated symptoms.   Past Medical History  Diagnosis Date  . Hypertension    Past Surgical History  Procedure Laterality Date  . Cesarean section     No family history on file. History  Substance Use Topics  . Smoking status: Current Every Day Smoker -- 0.20 packs/day    Types: Cigarettes  . Smokeless tobacco: Not on file  . Alcohol Use: No   OB History    No data available     Review of Systems  Constitutional: Negative for fever.  Gastrointestinal: Positive for nausea. Negative for vomiting.  Genitourinary: Positive for dysuria and frequency. Negative for vaginal bleeding and vaginal discharge.  Musculoskeletal: Positive for back pain.  All other systems reviewed and are negative.     Allergies  Review of patient's allergies indicates no known allergies.  Home Medications   Prior to Admission medications   Medication Sig Start Date End Date Taking? Authorizing Provider  NAPROXEN PO Take by mouth.   Yes Historical Provider, MD  azithromycin (ZITHROMAX) 250 MG tablet Take 250 mg by mouth daily.    Historical Provider, MD  ibuprofen (ADVIL,MOTRIN) 600 MG tablet Take 1 tablet (600 mg total) by mouth every 6 (six) hours as  needed for pain. 09/27/12   April Palumbo, MD  metroNIDAZOLE (FLAGYL) 500 MG tablet Take 500 mg by mouth 3 (three) times daily.    Historical Provider, MD  ondansetron (ZOFRAN ODT) 4 MG disintegrating tablet Take 1 tablet (4 mg total) by mouth every 8 (eight) hours as needed for nausea. 05/03/12   Kaitlyn Szekalski, PA-C   BP 131/84 mmHg  Pulse 88  Temp(Src) 98.3 F (36.8 C) (Oral)  Resp 20  Ht  (1.651 m)  Wt 240 lb (108.863 kg)  BMI 39.94 kg/m2  SpO2 100%  LMP 06/18/2014 Physical Exam  Constitutional: She appears well-developed and well-nourished. No distress.  HENT:  Head: Normocephalic and atraumatic.  Eyes: Conjunctivae and EOM are normal.  Neck: Neck supple. No tracheal deviation present.  Cardiovascular: Normal rate.   Pulmonary/Chest: Effort normal. No respiratory distress.  Abdominal:  Suprapubic tenderness; no CVA tenderness  Genitourinary: Vagina normal and uterus normal. Cervix exhibits no motion tenderness, no discharge and no friability. Right adnexum displays no mass, no tenderness and no fullness. Left adnexum displays no mass, no tenderness and no fullness. No vaginal discharge found.  Musculoskeletal: She exhibits tenderness.  Mild lumbar tenderness  Skin: Skin is warm and dry.  Psychiatric: She has a normal mood and affect. Her behavior is normal.  Nursing note and vitals reviewed.   ED Course  Procedures   DIAGNOSTIC STUDIES: Oxygen Saturation is 100% on RA, normal by my interpretation.  COORDINATION OF CARE: 6:59 PM Discussed treatment plan with pt which includes UA. She agreed to plan.   Labs Review Labs Reviewed  WET PREP, GENITAL - Abnormal; Notable for the following:    Clue Cells Wet Prep HPF POC FEW (*)    WBC, Wet Prep HPF POC FEW (*)    All other components within normal limits  URINALYSIS, ROUTINE W REFLEX MICROSCOPIC - Abnormal; Notable for the following:    Color, Urine AMBER (*)    Specific Gravity, Urine 1.031 (*)    All other  components within normal limits  PREGNANCY, URINE  GC/CHLAMYDIA PROBE AMP ([place])    Imaging Review No results found.   EKG Interpretation None      MDM   Final diagnoses:  Dysuria  Pelvic pain in female    Patient presenting with dysuria, frequency, urgency that started 2 days ago with lower abdominal tenderness. Mild nausea but denies any vomiting, fever or flank pain. Last menstrual period was 2 weeks ago patient does have a history of tubal ligation. She denies any vaginal symptoms however patient's urine is within normal limits. We'll do a pelvic exam to ensure that that is not the cause of her abdominal pain such as PID, adnexal origin or yeast.  7:41 PM Pelvic exam without acute findings.  Wet prep without concerning findings.  Encourage pt to use AZO as needed and ibuprofen for aches and pains.  Given strict return precautions.  Low suspicion for GC/chlamydia at this time.  I personally performed the services described in this documentation, which was scribed in my presence.  The recorded information has been reviewed and considered.    Mary SproutWhitney Enda Santo, MD 07/02/14 1954  Mary SproutWhitney Mary Losee, MD 07/02/14 [id number]

## 2014-07-03 LAB — URINE CULTURE: Colony Count: 50000

## 2014-07-03 LAB — GC/CHLAMYDIA PROBE AMP (~~LOC~~) NOT AT ARMC
Chlamydia: NEGATIVE
Neisseria Gonorrhea: NEGATIVE

## 2014-07-26 ENCOUNTER — Encounter (HOSPITAL_BASED_OUTPATIENT_CLINIC_OR_DEPARTMENT_OTHER): Payer: Self-pay | Admitting: Emergency Medicine

## 2014-07-26 ENCOUNTER — Emergency Department (HOSPITAL_BASED_OUTPATIENT_CLINIC_OR_DEPARTMENT_OTHER)
Admission: EM | Admit: 2014-07-26 | Discharge: 2014-07-26 | Disposition: A | Discharge status: Home / Self Care | Payer: BLUE CROSS/BLUE SHIELD | Attending: Emergency Medicine | Admitting: Emergency Medicine

## 2014-07-26 DIAGNOSIS — Z7982 Long term (current) use of aspirin: Secondary | ICD-10-CM | POA: Insufficient documentation

## 2014-07-26 DIAGNOSIS — F419 Anxiety disorder, unspecified: Secondary | ICD-10-CM

## 2014-07-26 DIAGNOSIS — R112 Nausea with vomiting, unspecified: Secondary | ICD-10-CM

## 2014-07-26 DIAGNOSIS — Z720 Tobacco use: Secondary | ICD-10-CM | POA: Insufficient documentation

## 2014-07-26 MED ORDER — ONDANSETRON 8 MG PO TBDP: 8.0000 mg | ORAL_TABLET | Freq: Three times a day (TID) | ORAL | Status: DC | PRN

## 2014-07-26 MED ORDER — ONDANSETRON 8 MG PO TBDP
8.0000 mg | ORAL_TABLET | Freq: Once | ORAL | Status: AC
  Administered 2014-07-26: 8 mg via ORAL
  Filled 2014-07-26: qty 1

## 2014-07-26 NOTE — ED Provider Notes (Signed)
CSN: [id number]     Arrival date & time 07/26/14  0031 History   First MD Initiated Contact with Patient 07/26/14 [phone]     Chief Complaint  Patient presents with  . Vomiting      (Consider location/radiation/quality/duration/timing/severity/associated sxs/prior Treatment) HPI  This is a 29 year old female with a history of anxiety. She is here with nausea and vomiting that began suddenly yesterday evening about 11:30 PM. She vomited multiple times and had hypersalivation but her symptoms have subsequently improved. She has been able to eat ice chips in the ED without further emesis. She has had some mild abdominal cramping with this. She denies diarrhea.  She has had episodic anxiety for the past 4 days. She characterizes her anxiety attacks as heart racing and chest tightness. She denies the symptoms at the present time.  History reviewed. No pertinent past medical history. Past Surgical History  Procedure Laterality Date  . Cesarean section     History reviewed. No pertinent family history. History  Substance Use Topics  . Smoking status: Current Every Day Smoker -- 0.50 packs/day    Types: Cigarettes  . Smokeless tobacco: Not on file  . Alcohol Use: No   OB History    No data available     Review of Systems  All other systems reviewed and are negative.   Allergies  Review of patient's allergies indicates no known allergies.  Home Medications   Prior to Admission medications   Medication Sig Start Date End Date Taking? Authorizing Provider  aspirin EC 81 MG tablet Take 325 mg by mouth daily.   Yes Historical Provider, MD   BP 124/62 mmHg  Pulse 91  Temp(Src) 98.5 F (36.9 C) (Oral)  Resp 18  Ht 5\' 5"  (1.651 m)  Wt 231 lb (104.781 kg)  BMI 38.44 kg/m2  SpO2 98%  LMP 06/04/2014   Physical Exam  General: Well-developed, well-nourished female in no acute distress; appearance consistent with age of record HENT: normocephalic; atraumatic Eyes: pupils equal,  round and reactive to light; extraocular muscles intact Neck: supple Heart: regular rate and rhythm Lungs: clear to auscultation bilaterally Abdomen: soft; nondistended; nontender; bowel sounds present Extremities: No deformity; full range of motion; pulses normal Neurologic: Awake, alert and oriented; motor function intact in all extremities and symmetric; no facial droop Skin: Warm and dry Psychiatric: Normal mood and affect    ED Course  Procedures (including critical care time)   EKG Interpretation   Date/Time:  Friday July 26 2014 00:48:09 EDT Ventricular Rate:  98 PR Interval:  170 QRS Duration: 88 QT Interval:  340 QTC Calculation: 434 R Axis:   56 Text Interpretation:  Normal sinus rhythm Normal ECG No previous ECGs  available Confirmed by Madhuri Vacca  MD, Jonny RuizJOHN ([id number]) on 07/26/2014 3:01:28 AM      MDM  3:53 AM Likely viral GI illness. Patient has already improved and is consuming ice without emesis.  Paula LibraJohn Jurney Overacker, MD 07/26/14 [phone]

## 2014-07-26 NOTE — ED Notes (Signed)
Pt reports vomiting that started tonight after eating, c/o substernal chest pain she relates to anxiety that has been present since monday

## 2014-09-09 ENCOUNTER — Emergency Department (HOSPITAL_BASED_OUTPATIENT_CLINIC_OR_DEPARTMENT_OTHER)
Admission: EM | Admit: 2014-09-09 | Discharge: 2014-09-10 | Disposition: A | Discharge status: Home / Self Care | Payer: BLUE CROSS/BLUE SHIELD | Attending: Emergency Medicine | Admitting: Emergency Medicine

## 2014-09-09 ENCOUNTER — Encounter (HOSPITAL_BASED_OUTPATIENT_CLINIC_OR_DEPARTMENT_OTHER): Payer: Self-pay | Admitting: Emergency Medicine

## 2014-09-09 DIAGNOSIS — Z791 Long term (current) use of non-steroidal anti-inflammatories (NSAID): Secondary | ICD-10-CM | POA: Insufficient documentation

## 2014-09-09 DIAGNOSIS — Z8739 Personal history of other diseases of the musculoskeletal system and connective tissue: Secondary | ICD-10-CM | POA: Diagnosis not present

## 2014-09-09 DIAGNOSIS — R21 Rash and other nonspecific skin eruption: Secondary | ICD-10-CM

## 2014-09-09 DIAGNOSIS — L259 Unspecified contact dermatitis, unspecified cause: Secondary | ICD-10-CM

## 2014-09-09 DIAGNOSIS — Z7982 Long term (current) use of aspirin: Secondary | ICD-10-CM | POA: Insufficient documentation

## 2014-09-09 DIAGNOSIS — Z720 Tobacco use: Secondary | ICD-10-CM | POA: Diagnosis not present

## 2014-09-09 HISTORY — DX: Plantar fascial fibromatosis: M72.2

## 2014-09-09 NOTE — ED Provider Notes (Signed)
CSN: [id number]     Arrival date & time 09/09/14  2224 History   First MD Initiated Contact with Patient 09/09/14 2351     Chief Complaint  Patient presents with  . Rash     (Consider location/radiation/quality/duration/timing/severity/associated sxs/prior Treatment) HPI Comments: Mary Mills is a 29 y.o. female with a PMHx of plantar fasciitis, who presents to the ED with complaints of diffuse erythematous rash over entire body x3 days. Reports the rash is itchy and spreading, unrelieved with cool compresses and hydrocortisone cream, worsened with heat exposure. Only new exposure is to new scent in soap/detergent. No new animals/plants, sick contacts, similar affected individuals at home, changes in meds, hot tub use, or insect bites. No drainage or weeping. No tongue/lip swelling, hoarse voice, URI symptoms, fevers, chills, CP, SOB, abd pain, n/v, urinary symptoms, numbness, tingling, weakness, or other symptoms.   Patient is a 29 y.o. female presenting with rash. The history is provided by the patient. No language interpreter was used.  Rash Location:  Full body Quality: itchiness and redness   Quality: not painful, not scaling and not weeping   Severity:  Mild Onset quality:  Gradual Duration:  3 days Timing:  Constant Progression:  Spreading Chronicity:  New Context: new detergent/soap   Context: not animal contact, not chemical exposure, not exposure to similar rash, not hot tub use, not insect bite/sting, not medications, not plant contact and not sick contacts   Relieved by:  Nothing Worsened by:  Heat Ineffective treatments:  Topical steroids Associated symptoms: no abdominal pain, no fever, no hoarse voice, no joint pain, no myalgias, no nausea, no periorbital edema, no shortness of breath, no sore throat, no throat swelling, no tongue swelling and not vomiting     Past Medical History  Diagnosis Date  . Plantar fasciitis, bilateral    Past Surgical History  Procedure  Laterality Date  . Cesarean section     No family history on file. History  Substance Use Topics  . Smoking status: Current Every Day Smoker -- 0.50 packs/day    Types: Cigarettes  . Smokeless tobacco: Not on file  . Alcohol Use: No   OB History    No data available     Review of Systems  Constitutional: Negative for fever and chills.  HENT: Negative for facial swelling, hoarse voice, sore throat and trouble swallowing.   Respiratory: Negative for shortness of breath.   Cardiovascular: Negative for chest pain.  Gastrointestinal: Negative for nausea, vomiting and abdominal pain.  Genitourinary: Negative for dysuria and hematuria.  Musculoskeletal: Negative for myalgias and arthralgias.  Skin: Positive for rash.  Allergic/Immunologic: Negative for immunocompromised state.  Neurological: Negative for weakness and numbness.  Psychiatric/Behavioral: Negative for confusion.   10 Systems reviewed and are negative for acute change except as noted in the HPI.    Allergies  Review of patient's allergies indicates no known allergies.  Home Medications   Prior to Admission medications   Medication Sig Start Date End Date Taking? Authorizing Provider  naproxen (NAPROSYN) 500 MG tablet Take 500 mg by mouth 2 (two) times daily with a meal.   Yes Historical Provider, MD  aspirin EC 81 MG tablet Take 325 mg by mouth daily.    Historical Provider, MD  ondansetron (ZOFRAN ODT) 8 MG disintegrating tablet Take 1 tablet (8 mg total) by mouth every 8 (eight) hours as needed for nausea or vomiting. 07/26/14   Paula Libra, MD   BP 127/80 mmHg  Pulse 88  Temp(Src) 98.3 F (36.8 C) (Oral)  Resp 18  Ht 5\' 5"  (1.651 m)  Wt 239 lb (108.41 kg)  BMI 39.77 kg/m2  SpO2 98%  LMP 09/02/2014 Physical Exam  Constitutional: She is oriented to person, place, and time. Vital signs are normal. She appears well-developed and well-nourished.  Non-toxic appearance. No distress.  Afebrile, nontoxic, NAD   HENT:  Head: Normocephalic and atraumatic.  Mouth/Throat: Mucous membranes are normal.  Eyes: Conjunctivae and EOM are normal. Right eye exhibits no discharge. Left eye exhibits no discharge.  Neck: Normal range of motion. Neck supple.  Cardiovascular: Normal rate.   Pulmonary/Chest: Effort normal. No respiratory distress.  Abdominal: Normal appearance. She exhibits no distension.  Musculoskeletal: Normal range of motion.  Neurological: She is alert and oriented to person, place, and time. She has normal strength. No sensory deficit.  Skin: Skin is warm, dry and intact. Rash noted. Rash is maculopapular.  Diffuse maculopapular slightly erythematous rash over entire body, with focal areas in intertrigonous regions of breasts and lower abdomen. No interdigital webspace involvement, no burrowing. Pruritic on exam. No scaling.  Psychiatric: She has a normal mood and affect. Her behavior is normal.  Nursing note and vitals reviewed.   ED Course  Procedures (including critical care time) Labs Review Labs Reviewed - No data to display  Imaging Review No results found.   EKG Interpretation None      MDM   Final diagnoses:  Rash  Contact dermatitis    29 y.o. female here with generalized rash x3 days. Changed soap/detergent 2wks ago, could be cause. Distribution generalized but some intertrigo areas are affected, could be yeast. Doesn't appear to be scabies, but pt very pruritic, therefore will empirically tx for scabies. Will also give hydrocortisone for itching and possible contact dermatitis. Will have her f/up with PCP in 1wk. I explained the diagnosis and have given explicit precautions to return to the ER including for any other new or worsening symptoms. The patient understands and accepts the medical plan as it's been dictated and I have answered their questions. Discharge instructions concerning home care and prescriptions have been given. The patient is STABLE and is discharged  to home in good condition.  BP 127/80 mmHg  Pulse 88  Temp(Src) 98.3 F (36.8 C) (Oral)  Resp 18  Ht 5\' 5"  (1.651 m)  Wt 239 lb (108.41 kg)  BMI 39.77 kg/m2  SpO2 98%  LMP 09/02/2014  Meds ordered this encounter  Medications  . hydrocortisone 2.5 % cream    Sig: Apply topically 2 (two) times daily as needed (itching).    Dispense:  15 g    Refill:  0    Order Specific Question:  Supervising Provider    Answer:  MILLER, BRIAN [[id number]]  . permethrin (ELIMITE) 5 % cream    Sig: Apply a generous amount of cream from head to feet, leave on for 8 to 14 hours, wash with soap/water    Dispense:  60 g    Refill:  0    Order Specific Question:  Supervising Provider    Answer:  Hyacinth MeekerMILLER, BRIAN [[id number]]  . clotrimazole (LOTRIMIN) 1 % cream    Sig: Apply to affected area 2 times daily x 10 days    Dispense:  15 g    Refill:  0    Order Specific Question:  Supervising Provider    Answer:  Evlyn KannerMILLER, BRIAN [[id number]]       Kennidee Heyne Camprubi-Soms, PA-C 09/10/14 0010  April Palumbo,  MD 09/10/14 1610

## 2014-09-09 NOTE — ED Notes (Signed)
29 yo w/ generalized rash all over. Small pink pin point rash. Has used hydrocortisone cream and cold compress. Denis pain.

## 2014-09-10 MED ORDER — HYDROCORTISONE 2.5 % EX CREA: TOPICAL_CREAM | Freq: Two times a day (BID) | CUTANEOUS | Status: DC | PRN

## 2014-09-10 MED ORDER — CLOTRIMAZOLE 1 % EX CREA: TOPICAL_CREAM | CUTANEOUS | Status: DC

## 2014-09-10 MED ORDER — PERMETHRIN 5 % EX CREA: TOPICAL_CREAM | CUTANEOUS | Status: DC

## 2014-09-10 NOTE — Discharge Instructions (Signed)
Take hydrocortisone cream, permethrin cream, and clotrimazole cream as prescribed. Wash all linens and clothing in hot water. Use benadryl or antihistamines as needed for itching. Continue your usual home medications. Get plenty of rest and drink plenty of fluids. Avoid any known triggers. Switch your detergent back to the old detergent. Please followup with your primary doctor for discussion of your diagnoses and further evaluation after today's visit in 1 week. Return to the ER for changes or worsening symptoms.   Rash A rash is a change in the color or feel of your skin. There are many different types of rashes. You may have other problems along with your rash. HOME CARE  Avoid the thing that caused your rash.  Do not scratch your rash.  You may take cools baths to help stop itching.  Only take medicines as told by your doctor.  Keep all doctor visits as told. GET HELP RIGHT AWAY IF:   Your pain, puffiness (swelling), or redness gets worse.  You have a fever.  You have new or severe problems.  You have body aches, watery poop (diarrhea), or you throw up (vomit).  Your rash is not better after 3 days. MAKE SURE YOU:   Understand these instructions.  Will watch your condition.  Will get help right away if you are not doing well or get worse. Document Released: 09/08/2007 Document Revised: 06/14/2011 Document Reviewed: 01/04/2011 [facility] Patient Information 2015 [place], [place]. This information is not intended to replace advice given to you by your health care provider. Make sure you discuss any questions you have with your health care provider.  Contact Dermatitis Contact dermatitis is a reaction to certain substances that touch the skin. Contact dermatitis can be either irritant contact dermatitis or allergic contact dermatitis. Irritant contact dermatitis does not require previous exposure to the substance for a reaction to occur.Allergic contact dermatitis only occurs if  you have been exposed to the substance before. Upon a repeat exposure, your body reacts to the substance.  CAUSES  Many substances can cause contact dermatitis. Irritant dermatitis is most commonly caused by repeated exposure to mildly irritating substances, such as:  Makeup.  Soaps.  Detergents.  Bleaches.  Acids.  Metal salts, such as nickel. Allergic contact dermatitis is most commonly caused by exposure to:  Poisonous plants.  Chemicals (deodorants, shampoos).  Jewelry.  Latex.  Neomycin in triple antibiotic cream.  Preservatives in products, including clothing. SYMPTOMS  The area of skin that is exposed may develop:  Dryness or flaking.  Redness.  Cracks.  Itching.  Pain or a burning sensation.  Blisters. With allergic contact dermatitis, there may also be swelling in areas such as the eyelids, mouth, or genitals.  DIAGNOSIS  Your caregiver can usually tell what the problem is by doing a physical exam. In cases where the cause is uncertain and an allergic contact dermatitis is suspected, a patch skin test may be performed to help determine the cause of your dermatitis. TREATMENT Treatment includes protecting the skin from further contact with the irritating substance by avoiding that substance if possible. Barrier creams, powders, and gloves may be helpful. Your caregiver may also recommend:  Steroid creams or ointments applied 2 times daily. For best results, soak the rash area in cool water for 20 minutes. Then apply the medicine. Cover the area with a plastic wrap. You can store the steroid cream in the refrigerator for a "chilly" effect on your rash. That may decrease itching. Oral steroid medicines may be needed  in more severe cases.  Antibiotics or antibacterial ointments if a skin infection is present.  Antihistamine lotion or an antihistamine taken by mouth to ease itching.  Lubricants to keep moisture in your skin.  Burow's solution to reduce  redness and soreness or to dry a weeping rash. Mix one packet or tablet of solution in 2 cups cool water. Dip a clean washcloth in the mixture, wring it out a bit, and put it on the affected area. Leave the cloth in place for 30 minutes. Do this as often as possible throughout the day.  Taking several cornstarch or baking soda baths daily if the area is too large to cover with a washcloth. Harsh chemicals, such as alkalis or acids, can cause skin damage that is like a burn. You should flush your skin for 15 to 20 minutes with cold water after such an exposure. You should also seek immediate medical care after exposure. Bandages (dressings), antibiotics, and pain medicine may be needed for severely irritated skin.  HOME CARE INSTRUCTIONS  Avoid the substance that caused your reaction.  Keep the area of skin that is affected away from hot water, soap, sunlight, chemicals, acidic substances, or anything else that would irritate your skin.  Do not scratch the rash. Scratching may cause the rash to become infected.  You may take cool baths to help stop the itching.  Only take over-the-counter or prescription medicines as directed by your caregiver.  See your caregiver for follow-up care as directed to make sure your skin is healing properly. SEEK MEDICAL CARE IF:   Your condition is not better after 3 days of treatment.  You seem to be getting worse.  You see signs of infection such as swelling, tenderness, redness, soreness, or warmth in the affected area.  You have any problems related to your medicines. Document Released: 03/19/2000 Document Revised: 06/14/2011 Document Reviewed: 08/25/2010 [facility] Patient Information 2015 [place], [place]. This information is not intended to replace advice given to you by your health care provider. Make sure you discuss any questions you have with your health care provider.

## 2014-11-18 ENCOUNTER — Emergency Department (HOSPITAL_BASED_OUTPATIENT_CLINIC_OR_DEPARTMENT_OTHER)
Admission: EM | Admit: 2014-11-18 | Discharge: 2014-11-18 | Disposition: A | Discharge status: Home / Self Care | Payer: BLUE CROSS/BLUE SHIELD | Attending: Emergency Medicine | Admitting: Emergency Medicine

## 2014-11-18 ENCOUNTER — Encounter (HOSPITAL_BASED_OUTPATIENT_CLINIC_OR_DEPARTMENT_OTHER): Payer: Self-pay | Admitting: Emergency Medicine

## 2014-11-18 DIAGNOSIS — J029 Acute pharyngitis, unspecified: Secondary | ICD-10-CM | POA: Diagnosis present

## 2014-11-18 DIAGNOSIS — M542 Cervicalgia: Secondary | ICD-10-CM | POA: Insufficient documentation

## 2014-11-18 DIAGNOSIS — J020 Streptococcal pharyngitis: Secondary | ICD-10-CM | POA: Diagnosis not present

## 2014-11-18 DIAGNOSIS — Z791 Long term (current) use of non-steroidal anti-inflammatories (NSAID): Secondary | ICD-10-CM | POA: Insufficient documentation

## 2014-11-18 DIAGNOSIS — Z720 Tobacco use: Secondary | ICD-10-CM | POA: Insufficient documentation

## 2014-11-18 LAB — RAPID STREP SCREEN (MED CTR MEBANE ONLY): Streptococcus, Group A Screen (Direct): POSITIVE — AB

## 2014-11-18 MED ORDER — PENICILLIN G BENZATHINE 1200000 UNIT/2ML IM SUSP
1.2000 10*6.[IU] | Freq: Once | INTRAMUSCULAR | Status: AC
  Administered 2014-11-18: 1.2 10*6.[IU] via INTRAMUSCULAR
  Filled 2014-11-18: qty 2

## 2014-11-18 MED ORDER — LIDOCAINE VISCOUS 2 % MT SOLN: 20.0000 mL | OROMUCOSAL | Status: DC | PRN

## 2014-11-18 NOTE — ED Notes (Signed)
Drinking PO flds

## 2014-11-18 NOTE — ED Notes (Signed)
Pain in throat  Onset yesterday

## 2014-11-18 NOTE — Discharge Instructions (Signed)
Strep Throat Return for difficulty breathing or swallowing. Follow up with [place] and wellness. Take Tylenol or Motrin if you develop a fever. Strep throat is an infection of the throat. It is caused by a germ. Strep throat spreads from person to person by coughing, sneezing, or close contact. HOME CARE  Rinse your mouth (gargle) with warm salt water (1 teaspoon salt in 1 cup of water). Do this 3 to 4 times per day or as needed for comfort.  Family members with a sore throat or fever should see a doctor.  Make sure everyone in your house washes their hands well.  Do not share food, drinking cups, or personal items.  Eat soft foods until your sore throat gets better.  Drink enough water and fluids to keep your pee (urine) clear or pale yellow.  Rest.  Stay home from school, daycare, or work until you have taken medicine for 24 hours.  Only take medicine as told by your doctor.  Take your medicine as told. Finish it even if you start to feel better. GET HELP RIGHT AWAY IF:   You have new problems, such as throwing up (vomiting) or bad headaches.  You have a stiff or painful neck, chest pain, trouble breathing, or trouble swallowing.  You have very bad throat pain, drooling, or changes in your voice.  Your neck puffs up (swells) or gets red and tender.  You have a fever.  You are very tired, your mouth is dry, or you are peeing less than normal.  You cannot wake up completely.  You get a rash, cough, or earache.  You have green, yellow-brown, or bloody spit.  Your pain does not get better with medicine. MAKE SURE YOU:   Understand these instructions.  Will watch your condition.  Will get help right away if you are not doing well or get worse. Document Released: 09/08/2007 Document Revised: 06/14/2011 Document Reviewed: 05/21/2010 [facility] Patient Information 2015 [place], [place]. This information is not intended to replace advice given to you by your health care  provider. Make sure you discuss any questions you have with your health care provider.

## 2014-11-18 NOTE — ED Provider Notes (Signed)
CSN: [id number]     Arrival date & time 11/18/14  0915 History   First MD Initiated Contact with Patient 11/18/14 [phone]     Chief Complaint  Patient presents with  . Sore Throat     (Consider location/radiation/quality/duration/timing/severity/associated sxs/prior Treatment) Patient is a 29 y.o. female presenting with pharyngitis. The history is provided by the patient. No language interpreter was used.  Sore Throat Associated symptoms include a sore throat. Pertinent negatives include no fever.  Mary Mills is a 29 y.o female with a history of strep throat who presents to the ED for a sore throat that began yesterday but has worsened since she got up this morning.  She states she took a cough drop to relieve the throat pain this morning. She denies any fever, chills, nausea, vomiting, abdominal pain, cough, shortness of breath, difficulty breathing. No history of rheumatic fever.  Past Medical History  Diagnosis Date  . Plantar fasciitis, bilateral    Past Surgical History  Procedure Laterality Date  . Cesarean section     No family history on file. Social History  Substance Use Topics  . Smoking status: Current Every Day Smoker -- 0.50 packs/day    Types: Cigarettes  . Smokeless tobacco: None  . Alcohol Use: No   OB History    No data available     Review of Systems  Constitutional: Negative for fever.  HENT: Positive for sore throat.       Allergies  Review of patient's allergies indicates no known allergies.  Home Medications   Prior to Admission medications   Medication Sig Start Date End Date Taking? Authorizing Provider  lidocaine (XYLOCAINE) 2 % solution Use as directed 20 mLs in the mouth or throat as needed for mouth pain. 11/18/14   Tyrique Sporn Patel-Mills, PA-C  naproxen (NAPROSYN) 500 MG tablet Take 500 mg by mouth 2 (two) times daily with a meal.    Historical Provider, MD   BP 139/82 mmHg  Pulse 106  Temp(Src) 99.3 F (37.4 C) (Oral)  Resp 20  Ht 5\' 5"   (1.651 m)  Wt 230 lb (104.327 kg)  BMI 38.27 kg/m2  SpO2 99% Physical Exam  Constitutional: She is oriented to person, place, and time. She appears well-developed and well-nourished.  HENT:  Head: Normocephalic and atraumatic.  Mild tonsillar swelling with erythema. No kissing tonsils. No hot potato voice. No exudates.  Patent airway. No drooling or trismus.  Eyes: Conjunctivae are normal.  Neck: Normal range of motion. Neck supple.  Right anterior cervical tenderness but no lymphadenopathy.  Cardiovascular: Normal rate, regular rhythm and normal heart sounds.   Pulmonary/Chest: Effort normal and breath sounds normal. No respiratory distress. She has no wheezes.  Abdominal: Soft. She exhibits no distension. There is no tenderness.  Musculoskeletal: Normal range of motion.  Neurological: She is alert and oriented to person, place, and time.  Skin: Skin is warm and dry.  Psychiatric: She has a normal mood and affect. Her behavior is normal.  Nursing note and vitals reviewed.   ED Course  Procedures (including critical care time) Labs Review Labs Reviewed  RAPID STREP SCREEN (NOT AT [facility]) - Abnormal; Notable for the following:    Streptococcus, Group A Screen (Direct) POSITIVE (*)    All other components within normal limits    Imaging Review No results found. I, Catha Gosselin, personally reviewed and evaluated these images and lab results as part of my medical decision-making.   EKG Interpretation None  MDM   Final diagnoses:  Strep pharyngitis  I reviewed the [facility] criteria.  Strep A positive. Patient is well-appearing and afebrile. She is tolerating by mouth fluids. I prescribed viscous lidocaine. I also discussed return precautions such as difficulty breathing or eating and gave her follow-up with [place] and wellness. Patient verbally agrees with the plan. Medications  penicillin g benzathine (BICILLIN LA) 1200000 UNIT/2ML injection 1.2 Million Units (1.2  Million Units Intramuscular Given 11/18/14 1052)         Catha Gosselin, PA-C 11/18/14 1100  Doug Sou, MD 11/18/14 1337

## 2014-11-26 ENCOUNTER — Emergency Department (HOSPITAL_BASED_OUTPATIENT_CLINIC_OR_DEPARTMENT_OTHER)
Admission: EM | Admit: 2014-11-26 | Discharge: 2014-11-26 | Disposition: A | Discharge status: Home / Self Care | Payer: BLUE CROSS/BLUE SHIELD | Attending: Emergency Medicine | Admitting: Emergency Medicine

## 2014-11-26 ENCOUNTER — Encounter (HOSPITAL_BASED_OUTPATIENT_CLINIC_OR_DEPARTMENT_OTHER): Payer: Self-pay | Admitting: Emergency Medicine

## 2014-11-26 DIAGNOSIS — Z720 Tobacco use: Secondary | ICD-10-CM | POA: Insufficient documentation

## 2014-11-26 DIAGNOSIS — J020 Streptococcal pharyngitis: Secondary | ICD-10-CM | POA: Diagnosis not present

## 2014-11-26 DIAGNOSIS — J029 Acute pharyngitis, unspecified: Secondary | ICD-10-CM | POA: Diagnosis present

## 2014-11-26 DIAGNOSIS — Z791 Long term (current) use of non-steroidal anti-inflammatories (NSAID): Secondary | ICD-10-CM | POA: Diagnosis not present

## 2014-11-26 DIAGNOSIS — Z8739 Personal history of other diseases of the musculoskeletal system and connective tissue: Secondary | ICD-10-CM | POA: Insufficient documentation

## 2014-11-26 LAB — RAPID STREP SCREEN (MED CTR MEBANE ONLY): Streptococcus, Group A Screen (Direct): POSITIVE — AB

## 2014-11-26 MED ORDER — AZITHROMYCIN 250 MG PO TABS: 250.0000 mg | ORAL_TABLET | Freq: Every day | ORAL | Status: DC

## 2014-11-26 MED ORDER — AZITHROMYCIN 250 MG PO TABS
500.0000 mg | ORAL_TABLET | Freq: Once | ORAL | Status: AC
  Administered 2014-11-26: 500 mg via ORAL
  Filled 2014-11-26: qty 2

## 2014-11-26 NOTE — ED Provider Notes (Signed)
CSN: [id number]     Arrival date & time 11/26/14  1815 History   First MD Initiated Contact with Patient 11/26/14 1843     Chief Complaint  Patient presents with  . Sore Throat     (Consider location/radiation/quality/duration/timing/severity/associated sxs/prior Treatment) HPI Comments: Patient presents with complaint of recurrent sore throat. Patient was seen in emergency department on 11/18/14 and had a positive strep test. At that point she was given a shot of penicillin and her symptoms improved, however over the past 3-4 days symptoms have recurred. She complains of sore throat without radiation. Pain is worse with swallowing. She is eating and drinking well. She denies fevers. She is taking over-the-counter medications without relief.  Patient is a 29 y.o. female presenting with pharyngitis. The history is provided by the patient and medical records.  Sore Throat Associated symptoms include a sore throat. Pertinent negatives include no abdominal pain, chills, congestion, coughing, fatigue, fever, headaches, myalgias, nausea, rash or vomiting.    Past Medical History  Diagnosis Date  . Plantar fasciitis, bilateral    Past Surgical History  Procedure Laterality Date  . Cesarean section     History reviewed. No pertinent family history. Social History  Substance Use Topics  . Smoking status: Current Every Day Smoker -- 0.50 packs/day    Types: Cigarettes  . Smokeless tobacco: None  . Alcohol Use: No   OB History    No data available     Review of Systems  Constitutional: Negative for fever, chills and fatigue.  HENT: Positive for sore throat. Negative for congestion, ear pain, rhinorrhea and sinus pressure.   Eyes: Negative for redness.  Respiratory: Negative for cough and wheezing.   Gastrointestinal: Negative for nausea, vomiting, abdominal pain and diarrhea.  Genitourinary: Negative for dysuria.  Musculoskeletal: Negative for myalgias and neck stiffness.  Skin:  Negative for rash.  Neurological: Negative for headaches.  Hematological: Negative for adenopathy.      Allergies  Review of patient's allergies indicates no known allergies.  Home Medications   Prior to Admission medications   Medication Sig Start Date End Date Taking? Authorizing Provider  lidocaine (XYLOCAINE) 2 % solution Use as directed 20 mLs in the mouth or throat as needed for mouth pain. 11/18/14   Hanna Patel-Mills, PA-C  naproxen (NAPROSYN) 500 MG tablet Take 500 mg by mouth 2 (two) times daily with a meal.    Historical Provider, MD   BP 117/68 mmHg  Pulse 96  Temp(Src) 98.7 F (37.1 C) (Oral)  Resp 20  SpO2 98%  LMP 11/15/2014 Physical Exam  Constitutional: She appears well-developed and well-nourished.  HENT:  Head: Normocephalic and atraumatic.  Right Ear: Tympanic membrane, external ear and ear canal normal.  Left Ear: Tympanic membrane, external ear and ear canal normal.  Nose: Nose normal. No mucosal edema or rhinorrhea.  Mouth/Throat: Uvula is midline and mucous membranes are normal. Mucous membranes are not dry. No oral lesions. No trismus in the jaw. No uvula swelling. Oropharyngeal exudate and posterior oropharyngeal erythema present. No posterior oropharyngeal edema or tonsillar abscesses.  Eyes: Conjunctivae are normal. Right eye exhibits no discharge. Left eye exhibits no discharge.  Neck: Normal range of motion. Neck supple.  Cardiovascular: Normal rate, regular rhythm and normal heart sounds.   Pulmonary/Chest: Effort normal and breath sounds normal. No respiratory distress. She has no wheezes. She has no rales.  Abdominal: Soft. There is no tenderness.  Lymphadenopathy:    She has no cervical adenopathy.  Neurological: She  is alert.  Skin: Skin is warm and dry.  Psychiatric: She has a normal mood and affect.  Nursing note and vitals reviewed.   ED Course  Procedures (including critical care time) Labs Review Labs Reviewed  RAPID STREP SCREEN  (NOT AT [facility]) - Abnormal; Notable for the following:    Streptococcus, Group A Screen (Direct) POSITIVE (*)    All other components within normal limits    Imaging Review No results found. I have personally reviewed and evaluated these images and lab results as part of my medical decision-making.   EKG Interpretation None       7:16 PM Patient seen and examined. Work-up initiated. Medications ordered.   Vital signs reviewed and are as follows: BP 117/68 mmHg  Pulse 96  Temp(Src) 98.7 F (37.1 C) (Oral)  Resp 20  SpO2 98%  LMP 11/15/2014  Will treat for recurrent strep throat. Patient otherwise appears well. NSAIDs for pain. Will give course of azithromycin given possible treatment failure.  MDM   Final diagnoses:  Acute streptococcal pharyngitis   Patient with strep throat. Suspect previous treatment failure with penicillin. Antibiotic switch to azithromycin. No evidence of peritonsillar abscess or other complication from her streptococcal pharyngitis. Patient appears well, nontoxic. She is eating and drinking well.    Renne Crigler, PA-C 11/26/14 1940  Gilda Crease, MD 11/27/14 [phone]

## 2014-11-26 NOTE — Discharge Instructions (Signed)
Please read and follow all provided instructions.  Your diagnoses today include:  1. Acute streptococcal pharyngitis     Tests performed today include:  Strep test: was POSITIVE for strep throat  Vital signs. See below for your results today.   Medications prescribed:   Azithromycin - antibiotic for respiratory infection  You have been prescribed an antibiotic medicine: take the entire course of medicine even if you are feeling better. Stopping early can cause the antibiotic not to work.  Take any medications prescribed only as directed.   Home care instructions:  Please read the educational materials provided and follow any instructions contained in this packet.  Follow-up instructions: Please follow-up with your primary care provider as needed for further evaluation of your symptoms.  Return instructions:   Please return to the Emergency Department if you experience worsening symptoms.   Return if you have worsening problems swallowing, your neck becomes swollen, you cannot swallow your saliva or your voice becomes muffled.   Return with high persistent fever, persistent vomiting, or if you have trouble breathing.   Please return if you have any other emergent concerns.  Additional Information:  Your vital signs today were: BP 117/68 mmHg   Pulse 96   Temp(Src) 98.7 F (37.1 C) (Oral)   Resp 20   SpO2 98%   LMP 11/15/2014 If your blood pressure (BP) was elevated above 135/85 this visit, please have this repeated by your doctor within one month. --------------

## 2014-11-26 NOTE — ED Notes (Signed)
Pt states she was seen in and tx for strep last week and last night throat started hurting again

## 2014-11-26 NOTE — ED Notes (Signed)
Report given to [facility]. Assumed pt care at this time.

## 2015-03-10 ENCOUNTER — Emergency Department (HOSPITAL_BASED_OUTPATIENT_CLINIC_OR_DEPARTMENT_OTHER)
Admission: EM | Admit: 2015-03-10 | Discharge: 2015-03-10 | Disposition: A | Discharge status: Home / Self Care | Payer: BLUE CROSS/BLUE SHIELD | Attending: Emergency Medicine | Admitting: Emergency Medicine

## 2015-03-10 ENCOUNTER — Encounter (HOSPITAL_BASED_OUTPATIENT_CLINIC_OR_DEPARTMENT_OTHER): Payer: Self-pay | Admitting: *Deleted

## 2015-03-10 DIAGNOSIS — F17210 Nicotine dependence, cigarettes, uncomplicated: Secondary | ICD-10-CM | POA: Diagnosis not present

## 2015-03-10 DIAGNOSIS — M791 Myalgia: Secondary | ICD-10-CM | POA: Insufficient documentation

## 2015-03-10 DIAGNOSIS — J029 Acute pharyngitis, unspecified: Secondary | ICD-10-CM | POA: Diagnosis not present

## 2015-03-10 DIAGNOSIS — R000 Tachycardia, unspecified: Secondary | ICD-10-CM | POA: Diagnosis not present

## 2015-03-10 DIAGNOSIS — Z8739 Personal history of other diseases of the musculoskeletal system and connective tissue: Secondary | ICD-10-CM | POA: Insufficient documentation

## 2015-03-10 DIAGNOSIS — Z791 Long term (current) use of non-steroidal anti-inflammatories (NSAID): Secondary | ICD-10-CM | POA: Diagnosis not present

## 2015-03-10 DIAGNOSIS — Z792 Long term (current) use of antibiotics: Secondary | ICD-10-CM | POA: Diagnosis not present

## 2015-03-10 LAB — RAPID STREP SCREEN (MED CTR MEBANE ONLY): STREPTOCOCCUS, GROUP A SCREEN (DIRECT): NEGATIVE

## 2015-03-10 MED ORDER — ACETAMINOPHEN 325 MG PO TABS
650.0000 mg | ORAL_TABLET | Freq: Once | ORAL | Status: AC
  Administered 2015-03-10: 650 mg via ORAL
  Filled 2015-03-10: qty 2

## 2015-03-10 NOTE — ED Notes (Signed)
Patient c/o of a one day history of generalized body aches, head ache, and sore throat.

## 2015-03-10 NOTE — ED Provider Notes (Signed)
CSN: [id number]     Arrival date & time 03/10/15  0840 History   None    Chief Complaint  Patient presents with  . Sore Throat     (Consider location/radiation/quality/duration/timing/severity/associated sxs/prior Treatment) Patient is a 29 y.o. female presenting with pharyngitis. The history is provided by the patient.  Sore Throat This is a recurrent problem. The current episode started yesterday. The problem occurs constantly. The problem has been gradually worsening. Associated symptoms comments: Sore throat, myalgias, chills.  No cough or congestion.  Pt has had problems with recurrent strep throat and feels like this is another episode. The symptoms are aggravated by swallowing. Nothing relieves the symptoms. She has tried nothing for the symptoms. The treatment provided no relief.    Past Medical History  Diagnosis Date  . Plantar fasciitis, bilateral    Past Surgical History  Procedure Laterality Date  . Cesarean section    . Tubal ligation     No family history on file. Social History  Substance Use Topics  . Smoking status: Current Every Day Smoker -- 0.50 packs/day    Types: Cigarettes  . Smokeless tobacco: None  . Alcohol Use: No   OB History    No data available     Review of Systems  All other systems reviewed and are negative.     Allergies  Review of patient's allergies indicates no known allergies.  Home Medications   Prior to Admission medications   Medication Sig Start Date End Date Taking? Authorizing Provider  azithromycin (ZITHROMAX) 250 MG tablet Take 1 tablet (250 mg total) by mouth daily. 11/26/14   Renne Crigler, PA-C  lidocaine (XYLOCAINE) 2 % solution Use as directed 20 mLs in the mouth or throat as needed for mouth pain. 11/18/14   Hanna Patel-Mills, PA-C  naproxen (NAPROSYN) 500 MG tablet Take 500 mg by mouth 2 (two) times daily with a meal.    Historical Provider, MD   BP 130/82 mmHg  Pulse 110  Temp(Src) 99.5 F (37.5 C) (Oral)   Resp 20  Ht  (1.626 m)  Wt 230 lb (104.327 kg)  BMI 39.46 kg/m2  SpO2 100%  LMP 02/17/2015 Physical Exam  Constitutional: She is oriented to person, place, and time. She appears well-developed and well-nourished. No distress.  HENT:  Head: Normocephalic and atraumatic.  Right Ear: Tympanic membrane normal.  Left Ear: Tympanic membrane normal.  Mouth/Throat: Oropharyngeal exudate, posterior oropharyngeal edema and posterior oropharyngeal erythema present. No tonsillar abscesses.  Eyes: Conjunctivae and EOM are normal. Pupils are equal, round, and reactive to light.  Neck: Normal range of motion. Neck supple.  Cardiovascular: Regular rhythm and intact distal pulses.  Tachycardia present.   No murmur heard. Pulmonary/Chest: Effort normal and breath sounds normal. No respiratory distress. She has no wheezes. She has no rales.  Musculoskeletal: Normal range of motion. She exhibits no edema or tenderness.  Lymphadenopathy:    She has cervical adenopathy.  Neurological: She is alert and oriented to person, place, and time.  Skin: Skin is warm and dry. No rash noted. No erythema.  Psychiatric: She has a normal mood and affect. Her behavior is normal.  Nursing note and vitals reviewed.   ED Course  Procedures (including critical care time) Labs Review Labs Reviewed  RAPID STREP SCREEN (NOT AT [facility])  CULTURE, GROUP A STREP    Imaging Review No results found. I have personally reviewed and evaluated these images and lab results as part of my medical decision-making.  EKG Interpretation None      MDM   Final diagnoses:  Pharyngitis    Patient is a 29 year old otherwise healthy female with episodes of recurrent strep throat who presents today with sore throat, myalgias, chills starting yesterday. The sore throat is getting worse and she came today because she was concerned for recurrent strep throat. She denies cough congestion or other URI symptoms. On exam patient has  cervical adenopathy and mild swelling of the left tonsil with evidence of early exudate starting.  9:41 AM Rapid strep negative. Culture was sent. At this time will not treat patient with antibiotics that she has had recurrent antibiotics for strep throat. Will wait for cultures to return.  Gwyneth SproutWhitney Cage Gupton, MD 03/10/15 [phone]

## 2015-03-10 NOTE — Discharge Instructions (Signed)

## 2015-03-14 LAB — CULTURE, GROUP A STREP: Strep A Culture: POSITIVE — AB

## 2015-03-15 ENCOUNTER — Telehealth (HOSPITAL_BASED_OUTPATIENT_CLINIC_OR_DEPARTMENT_OTHER): Payer: Self-pay | Admitting: Emergency Medicine

## 2015-03-15 NOTE — Progress Notes (Signed)
ED Antimicrobial Stewardship Positive Culture Follow Up   Mary Mills is an 29 y.o. female who [facility] with a chief complaint of  Chief Complaint  Patient presents with  . Sore Throat    Recent Results (from the past 720 hour(s))  Rapid strep screen (not at [facility])     Status: None   Collection Time: 03/10/15  8:55 AM  Result Value Ref Range Status   Streptococcus, Group A Screen (Direct) NEGATIVE NEGATIVE Final    Comment: DELTA CHECK NOTED (NOTE) A Rapid Antigen test may result negative if the antigen level in the sample is below the detection level of this test. The FDA has not cleared this test as a stand-alone test therefore the rapid antigen negative result has reflexed to a Group A Strep culture.   Culture, Group A Strep     Status: Abnormal   Collection Time: 03/10/15  8:55 AM  Result Value Ref Range Status   Strep A Culture Positive (A)  Corrected    Comment: (NOTE) Penicillin and ampicillin are drugs of choice for treatment of beta-hemolytic streptococcal infections. Susceptibility testing of penicillins and other beta-lactam agents approved by the FDA for treatment of beta-hemolytic streptococcal infections need not be performed routinely because nonsusceptible isolates are extremely rare in any beta-hemolytic streptococcus and have not been reported for Streptococcus pyogenes (group A). (CLSI 2011) Performed At: [facility] [place] [address] [place], [place] [id number] Mila HomerHancock William F MD EX:[id number]:[phone] CORRECTED ON 12/09 AT 1337: PREVIOUSLY REPORTED AS Comment     [x]  Patient discharged originally without antimicrobial agent and treatment is now indicated  New antibiotic prescription: Amoxicillin 500 mg PO BID x 7 days  ED Provider: Wynetta EmeryNicole Pisciotta, PA-C  Monike Bragdon L. Roseanne RenoStewart, PharmD PGY2 Infectious Diseases Pharmacy Resident Pager: [phone] 03/15/2015 8:13 AM

## 2015-03-15 NOTE — Telephone Encounter (Signed)
Post ED Visit - Positive Culture Follow-up: Successful Patient Follow-Up  Culture assessed and recommendations reviewed by: []  Enzo BiNathan Batchelder, Pharm.D. []  Celedonio MiyamotoJeremy Frens, Pharm.D., BCPS []  Garvin FilaMike Maccia, Pharm.D. []  Georgina PillionElizabeth Martin, Pharm.D., BCPS []  [place] VillageMinh Pham, [address]Pharm[address]D., BCPS, AAHIVP []  Estella HuskMichelle Turner, Pharm.D., BCPS, AAHIVP [x]  Tennis Mustassie Stewart, Pharm.D. []  Sherle Poeob Vincent, [address]Pharm[address]  Positive strepculture  [x]  Patient discharged without antimicrobial prescription and treatment is now indicated []  Organism is resistant to prescribed ED discharge antimicrobial []  Patient with positive blood cultures  Changes discussed with ED provider: Wynetta EmeryNicole Pisciotta PA New antibiotic prescription start Amoxicillin 500mg  po bid x 7 days  Attempting to reach patient    Mary MullMiller, Mary Mills 03/15/2015, 3:28 PM

## 2015-03-16 ENCOUNTER — Telehealth (HOSPITAL_BASED_OUTPATIENT_CLINIC_OR_DEPARTMENT_OTHER): Payer: Self-pay | Admitting: Emergency Medicine

## 2015-03-16 NOTE — Telephone Encounter (Signed)
Post ED Visit - Positive Culture Follow-up: Successful Patient Follow-Up  Culture assessed and recommendations reviewed by: []  Enzo BiNathan Batchelder, Pharm.D. []  Celedonio MiyamotoJeremy Frens, Pharm.D., BCPS []  Garvin FilaMike Maccia, Pharm.D. []  Georgina PillionElizabeth Martin, Pharm.D., BCPS []  [place] Pham, [address]Pharm[address]D., BCPS, AAHIVP []  Estella HuskMichelle Turner, Pharm.D., BCPS, AAHIVP [x]  Tennis Mustassie Stewart, Pharm.D. []  Sherle Poeob Vincent, [place]D.  Positive strep culture  [x]  Patient discharged without antimicrobial prescription and treatment is now indicated []  Organism is resistant to prescribed ED discharge antimicrobial []  Patient with positive blood cultures  Changes discussed with ED provider:Nicole Pisciotta PA New antibiotic prescription Amoxicillin 500mg  po bid x 7 days [facility]  Contacted patient, 03/16/15 1205   Berle MullMiller, Oryn Casanova 03/16/2015, 12:05 PM

## 2015-04-26 ENCOUNTER — Encounter (HOSPITAL_BASED_OUTPATIENT_CLINIC_OR_DEPARTMENT_OTHER): Payer: Self-pay | Admitting: Emergency Medicine

## 2015-04-26 ENCOUNTER — Emergency Department (HOSPITAL_BASED_OUTPATIENT_CLINIC_OR_DEPARTMENT_OTHER)
Admission: EM | Admit: 2015-04-26 | Discharge: 2015-04-26 | Disposition: A | Discharge status: Home / Self Care | Payer: BLUE CROSS/BLUE SHIELD | Attending: Emergency Medicine | Admitting: Emergency Medicine

## 2015-04-26 DIAGNOSIS — Z791 Long term (current) use of non-steroidal anti-inflammatories (NSAID): Secondary | ICD-10-CM | POA: Diagnosis not present

## 2015-04-26 DIAGNOSIS — Z8739 Personal history of other diseases of the musculoskeletal system and connective tissue: Secondary | ICD-10-CM | POA: Insufficient documentation

## 2015-04-26 DIAGNOSIS — Z87891 Personal history of nicotine dependence: Secondary | ICD-10-CM | POA: Insufficient documentation

## 2015-04-26 DIAGNOSIS — Z792 Long term (current) use of antibiotics: Secondary | ICD-10-CM | POA: Diagnosis not present

## 2015-04-26 DIAGNOSIS — J029 Acute pharyngitis, unspecified: Secondary | ICD-10-CM | POA: Insufficient documentation

## 2015-04-26 DIAGNOSIS — J069 Acute upper respiratory infection, unspecified: Secondary | ICD-10-CM | POA: Diagnosis not present

## 2015-04-26 LAB — RAPID STREP SCREEN (MED CTR MEBANE ONLY): Streptococcus, Group A Screen (Direct): NEGATIVE

## 2015-04-26 MED ORDER — FLUTICASONE PROPIONATE 50 MCG/ACT NA SUSP: 2.0000 | Freq: Every day | NASAL | Status: DC

## 2015-04-26 NOTE — ED Provider Notes (Signed)
CSN: [id number]     Arrival date & time 04/26/15  2127 History   First MD Initiated Contact with Patient 04/26/15 2300     Chief Complaint  Patient presents with  . Sore Throat     (Consider location/radiation/quality/duration/timing/severity/associated sxs/prior Treatment) The history is provided by the patient and medical records. No language interpreter was used.     Mary Mills is a 30 y.o. female  with a hx of recurrent strep pharyngitis presents to the Emergency Department complaining of gradual, persistent, progressively worsening sore throat onset yesterday morning. Associated symptoms include postnasal drip, rhinorrhea, nasal congestion, myalgias, subjective fever.  Patient denies chills, chest pain, cough, abdominal pain, nausea, vomiting, diarrhea, cervical adenopathy, neck pain, neck stiffness. No treatments prior to arrival. Eating makes the pain worse. Nothing makes it better.    Past Medical History  Diagnosis Date  . Plantar fasciitis, bilateral    Past Surgical History  Procedure Laterality Date  . Cesarean section    . Tubal ligation     No family history on file. Social History  Substance Use Topics  . Smoking status: Former Smoker -- 0.50 packs/day    Types: Cigarettes  . Smokeless tobacco: None  . Alcohol Use: No   OB History    No data available     Review of Systems  Constitutional: Negative for fever, chills, appetite change and fatigue.  HENT: Positive for congestion, postnasal drip, rhinorrhea, sinus pressure and sore throat. Negative for ear discharge, ear pain and mouth sores.   Eyes: Negative for visual disturbance.  Respiratory: Negative for cough, chest tightness, shortness of breath, wheezing and stridor.   Cardiovascular: Negative for chest pain, palpitations and leg swelling.  Gastrointestinal: Negative for nausea, vomiting, abdominal pain and diarrhea.  Genitourinary: Negative for dysuria, urgency, frequency and hematuria.   Musculoskeletal: Negative for myalgias, back pain, arthralgias and neck stiffness.  Skin: Negative for rash.  Neurological: Negative for syncope, light-headedness, numbness and headaches.  Hematological: Negative for adenopathy.  Psychiatric/Behavioral: The patient is not nervous/anxious.   All other systems reviewed and are negative.     Allergies  Review of patient's allergies indicates no known allergies.  Home Medications   Prior to Admission medications   Medication Sig Start Date End Date Taking? Authorizing Provider  azithromycin (ZITHROMAX) 250 MG tablet Take 1 tablet (250 mg total) by mouth daily. 11/26/14   Renne Crigler, PA-C  fluticasone (FLONASE) 50 MCG/ACT nasal spray Place 2 sprays into both nostrils daily. 04/26/15   Indira Sorenson, PA-C  lidocaine (XYLOCAINE) 2 % solution Use as directed 20 mLs in the mouth or throat as needed for mouth pain. 11/18/14   Hanna Patel-Mills, PA-C  naproxen (NAPROSYN) 500 MG tablet Take 500 mg by mouth 2 (two) times daily with a meal.    Historical Provider, MD   BP 137/88 mmHg  Pulse 99  Temp(Src) 98.4 F (36.9 C) (Oral)  Resp 18  Ht  (1.651 m)  Wt 104.327 kg  BMI 38.27 kg/m2  SpO2 100%  LMP 04/15/2015 (Approximate) Physical Exam  Constitutional: She appears well-developed and well-nourished. No distress.  HENT:  Head: Normocephalic and atraumatic.  Right Ear: Tympanic membrane, external ear and ear canal normal.  Left Ear: Tympanic membrane, external ear and ear canal normal.  Nose: Mucosal edema and rhinorrhea present.  Mouth/Throat: Uvula is midline and mucous membranes are normal. Mucous membranes are not dry. No trismus in the jaw. No uvula swelling. Posterior oropharyngeal edema and posterior oropharyngeal  erythema present. No oropharyngeal exudate or tonsillar abscesses.  Posterior oropharynx with erythema and minimal edema without exudate on the tonsils  Eyes: Conjunctivae are normal.  Neck: Normal range of  motion, full passive range of motion without pain and phonation normal. No tracheal tenderness, no spinous process tenderness and no muscular tenderness present. No rigidity. No erythema and normal range of motion present. No Brudzinski's sign and no Kernig's sign noted.  Range of motion without pain  No midline or paraspinal tenderness Normal phonation No stridor Handling secretions without difficulty No nuchal rigidity or meningeal signs  Cardiovascular: Normal rate, regular rhythm and normal heart sounds.   Pulses:      Radial pulses are 2+ on the right side, and 2+ on the left side.  Pulmonary/Chest: Effort normal and breath sounds normal. No stridor. No respiratory distress. She has no decreased breath sounds. She has no wheezes.  Equal chest expansion, clear and equal breath sounds without focal wheezes, rhonchi or rales  Musculoskeletal: Normal range of motion.  Lymphadenopathy:       Head (right side): No submental, no submandibular, no tonsillar, no preauricular, no posterior auricular and no occipital adenopathy present.       Head (left side): No submental, no submandibular, no tonsillar, no preauricular, no posterior auricular and no occipital adenopathy present.    She has no cervical adenopathy.       Right cervical: No superficial cervical, no deep cervical and no posterior cervical adenopathy present.      Left cervical: No superficial cervical, no deep cervical and no posterior cervical adenopathy present.  Neurological: She is alert.  Alert and oriented Moves all extremities without ataxia  Skin: Skin is warm and dry. She is not diaphoretic.  Psychiatric: She has a normal mood and affect.  Nursing note and vitals reviewed.   ED Course  Procedures (including critical care time) Labs Review Labs Reviewed  RAPID STREP SCREEN (NOT AT [facility])  CULTURE, GROUP A STREP [facility])    MDM   Final diagnoses:  Viral pharyngitis  Viral URI   Mary Mills presents with sore  throat.  Pt afebrile without tonsillar exudate, negative strep. Presents with no cervical lymphadenopathy, but mild dysphagia; diagnosis of viral pharyngitis. Pt also with URI symptoms.  No abx indicated. DC w symptomatic tx for pain.  Pt does not appear dehydrated, but did discuss importance of water rehydration. Presentation non concerning for PTA or infxn spread to soft tissue. No trismus or uvula deviation. Specific return precautions discussed. Pt able to drink water in ED without difficulty with intact air way. Recommended PCP follow up.   Dahlia Client Zianne Schubring, PA-C 04/26/15 2316  Paula Libra, MD 04/27/15 [phone]

## 2015-04-26 NOTE — ED Notes (Signed)
Sore throat since yesterday.

## 2015-04-26 NOTE — ED Notes (Signed)
Pt d/c home w/all belongings, pt a/o x4, pt prescribed 1 RX, pt driven home by boyfriend at bedside.

## 2015-04-26 NOTE — Discharge Instructions (Signed)
1. Medications: flonase, mucinex, usual home medications 2. Treatment: rest, drink plenty of fluids, take tylenol or ibuprofen for fever control and body aches, swish with warm salt water to help throat pain 3. Follow Up: Please followup with your primary doctor in 3 days for discussion of your diagnoses and further evaluation after today's visit; if you do not have a primary care doctor use the resource guide provided to find one; Return to the ER for high fevers, difficulty breathing or other concerning symptoms    Pharyngitis Pharyngitis is redness, pain, and swelling (inflammation) of your pharynx.  CAUSES  Pharyngitis is usually caused by infection. Most of the time, these infections are from viruses (viral) and are part of a cold. However, sometimes pharyngitis is caused by bacteria (bacterial). Pharyngitis can also be caused by allergies. Viral pharyngitis may be spread from person to person by coughing, sneezing, and personal items or utensils (cups, forks, spoons, toothbrushes). Bacterial pharyngitis may be spread from person to person by more intimate contact, such as kissing.  SIGNS AND SYMPTOMS  Symptoms of pharyngitis include:   Sore throat.   Tiredness (fatigue).   Low-grade fever.   Headache.  Joint pain and muscle aches.  Skin rashes.  Swollen lymph nodes.  Plaque-like film on throat or tonsils (often seen with bacterial pharyngitis). DIAGNOSIS  Your health care provider will ask you questions about your illness and your symptoms. Your medical history, along with a physical exam, is often all that is needed to diagnose pharyngitis. Sometimes, a rapid strep test is done. Other lab tests may also be done, depending on the suspected cause.  TREATMENT  Viral pharyngitis will usually get better in 3-4 days without the use of medicine. Bacterial pharyngitis is treated with medicines that kill germs (antibiotics).  HOME CARE INSTRUCTIONS   Drink enough water and fluids  to keep your urine clear or pale yellow.   Only take over-the-counter or prescription medicines as directed by your health care provider:   If you are prescribed antibiotics, make sure you finish them even if you start to feel better.   Do not take aspirin.   Get lots of rest.   Gargle with 8 oz of salt water ( tsp of salt per 1 qt of water) as often as every 1-2 hours to soothe your throat.   Throat lozenges (if you are not at risk for choking) or sprays may be used to soothe your throat. SEEK MEDICAL CARE IF:   You have large, tender lumps in your neck.  You have a rash.  You cough up green, yellow-brown, or bloody spit. SEEK IMMEDIATE MEDICAL CARE IF:   Your neck becomes stiff.  You drool or are unable to swallow liquids.  You vomit or are unable to keep medicines or liquids down.  You have severe pain that does not go away with the use of recommended medicines.  You have trouble breathing (not caused by a stuffy nose). MAKE SURE YOU:   Understand these instructions.  Will watch your condition.  Will get help right away if you are not doing well or get worse.   This information is not intended to replace advice given to you by your health care provider. Make sure you discuss any questions you have with your health care provider.   Document Released: 03/22/2005 Document Revised: 01/10/2013 Document Reviewed: 11/27/2012 Elsevier Interactive Patient Education 2016 [employer].    Emergency Department Resource Guide 1) Find a Librarian, academic and [place] [employer] [place] [employer] [facility] [place]  Although you won't have to find out who is covered by your insurance plan, it is a good idea to ask around and get recommendations. You will then need to call the office and see if the doctor you have chosen will accept you as a new patient and what types of options they offer for patients who are self-pay. Some doctors offer discounts or will set up payment plans for their patients who do not have insurance,  but you will need to ask so you aren't surprised when you get to your appointment.  2) Contact Your Local Health Department Not all health departments have doctors that can see patients for sick visits, but many do, so it is worth a call to see if yours does. If you don't know where your local health department is, you can check in your phone book. The CDC also has a tool to help you locate your state's health department, and many state websites also have listings of all of their local health departments.  3) Find a Walk-in Clinic If your illness is not likely to be very severe or complicated, you may want to try a walk in clinic. These are popping up all over the country in pharmacies, drugstores, and shopping centers. They're usually staffed by nurse practitioners or physician assistants that have been trained to treat common illnesses and complaints. They're usually fairly quick and inexpensive. However, if you have serious medical issues or chronic medical problems, these are probably not your best option.  No Primary Care Doctor: - Call Health Connect at  [phone] - they can help you locate a primary care doctor that  accepts your insurance, provides certain services, etc. - Physician Referral Service- [phone]  Chronic Pain Problems: Organization         Address  Phone   Notes  [facility]  [phone] Patients need to be referred by their primary care doctor.   Medication Assistance: Organization         Address  Phone   Notes  [facility] Medication [facility] [address] [place], [place] [address] [phone] --Must be a resident of [facility] -- Must have NO insurance coverage whatsoever (no Medicaid/ Medicare, etc.) -- The pt. MUST have a primary care doctor that directs their care regularly and follows them in the community   [facility]  [phone]   [employer]  [phone]    Agencies that provide inexpensive  medical care: Organization         Address  Phone   Notes  [facility]  [phone]   [facility]    [phone]   [facility] [address] [place], [place] [address] [phone]   [facility] [address] [place], [place] [phone]   Planned Parenthood    [phone]   [facility]    [phone]   [facility] and [facility]  [address], [place] Phone:  [phone], Fax:  [phone] Hours of Operation:  9 am - 6 pm, M-F.  Also accepts Medicaid/Medicare and self-pay.  [facility]  [address], [place] Phone: [phone], Fax: [phone]. Hours of Operation:  8:30 am - 5:30 pm, M-F.  Also accepts Medicaid and self-pay.  [facility] [place] [address], [employer] Phone: [phone]   [facility]  [address] [place], [place] [phone], Ext. [phone] Mondays & Thursdays: 7-9 AM.  First 15 patients are seen on a first come, first serve basis.    Medicaid-accepting [facility] Providers:  Organization         Address  Phone   Notes  [facility] [address],  [phone] Also accepts self-pay patients.  [facility] [address] Laurell Josephs [place], [place]  [phone]   [facility] [address], [place] [phone]   [facility] [address], [place] [phone]   Renaye Rakers [address], [place]   [phone] Only accepts [place] patients after they have their name applied to their card.   Self-Pay (no insurance) in [facility]:  Organization         Address  Phone   Notes  Sickle Cell Patients, [facility] Internal Medicine [address] [place], [place] [phone]   [facility] [address] [place], [place] [phone]   [facility] [place]  [address], [place] [phone]   [facility]/Dr. Osei-Bonsu  [address], [place] or [address], [place] [phone] Phone number for both [place] and [place] locations is the same.  [facility] [address] [place] [phone]   [facility] [address] [place] or [address] [phone]   [facility] [address] [place] [phone], phone; [phone], fax Sees patients 1st and 3rd Saturday of every month.  Must not qualify for public or private insurance (i.e. Medicaid, Medicare, [place] Health Choice, Veterans' Benefits)  Household income should be no more than 200% of the poverty level The clinic cannot treat you if you are pregnant or think you are pregnant  Sexually transmitted diseases are not treated at the clinic.    Dental Care: Organization         Address  Phone  Notes  [facility] [address] [place], [place] [phone] Accepts children up to age 31 who are enrolled in [place] or [place] Health Choice; pregnant women with a Medicaid card; and children who have applied for Medicaid or [place] Health Choice, but were declined, whose parents can pay a reduced fee at time of service.  [facility] of [facility]  [address], [place] [phone] Accepts children up to age 16 who are enrolled in [place] or [place] Health Choice; pregnant women with a Medicaid card; and children who have applied for Medicaid or  Health Choice, but were declined, whose parents can pay a reduced fee at time of service.  [facility]  [address] [place], [place] [phone] Patients are seen by appointment only. Walk-ins are not accepted.  [facility] will see patients 78 years of age and older. Monday - Tuesday (8am-5pm) Most Wednesdays (8:30-5pm) $30 per visit, cash only  [facility] Adult Dental Access PROGRAM  [address], [facility] [phone] Patients are seen by appointment only. Walk-ins are not accepted. [facility] will see patients 67 years of age and older. One Wednesday Evening (Monthly: Volunteer Based).  $30 per visit, cash only  [employer] of Dentistry Clinics  [phone] for adults; Children under age 45, call Graduate Pediatric Dentistry at [phone]. Children aged 20-14, please call [phone] to request a pediatric application.  Dental services are provided in all areas of dental care including fillings, crowns and bridges, complete and partial dentures, implants, gum treatment, root canals, and extractions. Preventive care is also provided. Treatment is provided to both adults and children. Patients are selected via a lottery and there is often a waiting list.   [facility] [address], [place]  [phone] [email or web address]   [facility] [address] [place], [place] [phone], Ext. [phone] Second and Fourth Thursday of each month, opens at 6:30 AM; Clinic ends at 9 AM.  Patients are seen on a first-come first-served basis, and a limited number are seen during each clinic.   [facility]  [address] Ether Griffins [place], [place] [phone]   Eligibility Requirements You must have lived in [place], [place], or [place] counties for at least the last three months.   You cannot be eligible for state or federal sponsored [place], including [employer], [place], or [employer].   You generally cannot be eligible for healthcare insurance through your employer.    How to apply: Eligibility screenings are held every Tuesday and Wednesday afternoon from 1:00 pm until 4:00 pm. You do not need an appointment for the  interview!  [facility] [address] [place], [place] [phone]   [facility] Health Department  [phone]   [facility] Health Department  [phone]   [facility] Health Department  [phone]    Behavioral Health Resources in the Community: Intensive Outpatient Programs Organization         Address  Phone  Notes  [facility] Services [address] [place] [address] [place], [place] [phone]   [facility] Outpatient [address] [place], [place] [phone]   [facility] [address] [place], [place]  [phone]   [facility] Mental Health [address],  [place], [place] [phone] or [phone]   Substance Abuse Resources Organization         Address  Phone  Notes  [facility]  [phone]   [facility]  [phone]   [facility]  [phone]   Floydene Flock  [phone]   Residential & Outpatient Substance Abuse Program  [phone]   Psychological Services Organization         Address  Phone  Notes  [facility] Behavioral Health  [phone]   [facility] Services  [phone]   [facility] Mental Health [address] [place] [phone] or [phone]    Mobile Crisis Teams Organization         Address  Phone  Notes  [facility], Mobile Crisis Care Unit  [phone]   [facility]  [address] [place], [place] [phone]   Doristine Locks [address] [place] [phone]    Self-Help/Support Groups Organization         Address  Phone             Notes  [facility] [place] - variety of support groups  336- I7437963 Call for more information  Narcotics Anonymous (NA), [facility] [address], [employer] [place]  2 meetings at this location   Statistician  Address  Phone  Notes  [facility]  [address],    [place]  [phone]   [facility]  [address], [place] [phone], [place], [place]   [place], [place] [phone] Admissions: 8am-3pm M-F  Incentives Substance [place] [address]    [place], [place] [phone]   [facility] [address] [place], [place], [place]   [facility] [address]  [place], [place]   [facility] - Intensive Outpatient [address] Kristeen [address], [place], [place]   [facility] ([place] [place].,  [place], [place] [phone] or [phone]   [facility] ([facility]) [address] [place], [place] Accepts Medicaid  [facility] [address],  [place] [phone] Substance Abuse/Addiction Treatment   [facility] Organization         Address  Phone  Notes  [facility]  [phone]   Domenic Schwab, PhD [address] Arlis Porta [place], [place]   [phone] or [phone]   [place], [place] [phone]   [facility] [address] [place], [place] [phone] Insurance/Medicaid/sponsorship through [facility] and Families [address] [place]                                    [place], [place] [phone] [place] [address] [place], [place] [phone]    Dr. Adele Schilder  [phone]   [facility] Dept. 1) [address] [place] [address] [place] 2) [place] 3)  [address], [place] [phone]  [phone]   [place] [phone] or [phone] (After Hours)

## 2015-04-29 LAB — CULTURE, GROUP A STREP (THRC)

## 2015-11-21 ENCOUNTER — Emergency Department (HOSPITAL_BASED_OUTPATIENT_CLINIC_OR_DEPARTMENT_OTHER)
Admission: EM | Admit: 2015-11-21 | Discharge: 2015-11-21 | Disposition: A | Discharge status: Home / Self Care | Payer: BLUE CROSS/BLUE SHIELD | Attending: Emergency Medicine | Admitting: Emergency Medicine

## 2015-11-21 ENCOUNTER — Encounter (HOSPITAL_BASED_OUTPATIENT_CLINIC_OR_DEPARTMENT_OTHER): Payer: Self-pay

## 2015-11-21 DIAGNOSIS — Z87891 Personal history of nicotine dependence: Secondary | ICD-10-CM | POA: Insufficient documentation

## 2015-11-21 DIAGNOSIS — J029 Acute pharyngitis, unspecified: Secondary | ICD-10-CM | POA: Insufficient documentation

## 2015-11-21 HISTORY — DX: Streptococcal pharyngitis: J02.0

## 2015-11-21 LAB — RAPID STREP SCREEN (MED CTR MEBANE ONLY): STREPTOCOCCUS, GROUP A SCREEN (DIRECT): NEGATIVE

## 2015-11-21 MED ORDER — LIDOCAINE VISCOUS 2 % MT SOLN
20.0000 mL | Freq: Once | OROMUCOSAL | Status: AC
  Administered 2015-11-21: 20 mL via OROMUCOSAL
  Filled 2015-11-21: qty 30

## 2015-11-21 MED ORDER — LIDOCAINE VISCOUS 2 % MT SOLN: 15.0000 mL | Freq: Four times a day (QID) | OROMUCOSAL | 0 refills | Status: AC | PRN

## 2015-11-21 MED ORDER — IBUPROFEN 400 MG PO TABS
400.0000 mg | ORAL_TABLET | Freq: Once | ORAL | Status: AC
  Administered 2015-11-21: 400 mg via ORAL
  Filled 2015-11-21: qty 1

## 2015-11-21 MED ORDER — PREDNISONE 50 MG PO TABS: ORAL_TABLET | ORAL | 0 refills | Status: AC

## 2015-11-21 MED ORDER — PREDNISONE 50 MG PO TABS
60.0000 mg | ORAL_TABLET | Freq: Once | ORAL | Status: AC
  Administered 2015-11-21: 60 mg via ORAL
  Filled 2015-11-21: qty 1

## 2015-11-21 NOTE — ED Triage Notes (Signed)
Sore throat started last night-NAD

## 2015-11-21 NOTE — ED Notes (Signed)
PA at bedside.

## 2015-11-21 NOTE — ED Provider Notes (Signed)
[facility] Provider Note   CSN: [id number] Arrival date & time: 11/21/15  2038  By signing my name below, I, Linna Darner, attest that this documentation has been prepared under the direction and in the presence of non-physician practitioner, Wynetta Emery, PA-C. Electronically Signed: Linna Darner, Scribe. 11/21/2015. 9:17 PM.  History   Chief Complaint Chief Complaint  Patient presents with  . Sore Throat    The history is provided by the patient. No language interpreter was used.      HPI Comments: PUALANI BORAH is a 30 y.o. female with PMHx of strep throat who presents to the Emergency Department complaining of sudden onset, constant, sore throat beginning last night. She endorses associated chills and some decreased appetite. 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 She took Nyquil a couple of hours ago with no relief. Pt notes she gets strep throat often. She notes no allergies to medication and states she cannot be pregnant (tubal ligation). She denies ear pain, cough, fever, nausea, vomiting, or any other associated symptoms.  Past Medical History:  Diagnosis Date  . Plantar fasciitis, bilateral   . Strep throat     There are no active problems to display for this patient.   Past Surgical History:  Procedure Laterality Date  . CESAREAN SECTION    . TUBAL LIGATION      OB History    No data available       Home Medications    Prior to Admission medications   Medication Sig Start Date End Date Taking? Authorizing Provider  lidocaine (XYLOCAINE) 2 % solution Use as directed 15 mLs in the mouth or throat every 6 (six) hours as needed for mouth pain. 11/21/15   Ashanna Heinsohn, PA-C  naproxen (NAPROSYN) 500 MG tablet Take 500 mg by mouth 2 (two) times daily with a meal.    Historical Provider, MD  predniSONE (DELTASONE) 50 MG tablet Take 1 tablet daily with breakfast 11/21/15   Wynetta Emery, PA-C      Family History No family history on file.  Social History Social History  Substance Use Topics  . Smoking status: Former Smoker    Packs/day: 0.50    Types: Cigarettes  . Smokeless tobacco: Never Used  . Alcohol use No     Allergies   Review of patient's allergies indicates no known allergies.   Review of Systems Review of Systems  A complete 10 system review of systems was obtained and all systems are negative except as noted in the HPI and PMH.   Physical Exam Updated Vital Signs BP 137/86 (BP Location: Left Arm)   Pulse 94   Temp 99.3 F (37.4 C) (Oral)   Resp 18   Ht 5\' 6"  (1.676 m)   Wt 112 kg   LMP 11/20/2015   BMI 39.87 kg/m   Physical Exam  Constitutional: She is oriented to person, place, and time. She appears well-developed and well-nourished. No distress.  HENT:  Head: Normocephalic and atraumatic.  Right Ear: External ear normal.  Left Ear: External ear normal.  Mouth/Throat: Oropharyngeal exudate present.  No drooling or stridor. Posterior pharynx mildly erythematous with 2+ bilateral tonsillar hypertrophy. Scattered exudate. Soft palate rises symmetrically. No TTP or induration under tongue.   No tenderness to palpation of frontal or bilateral maxillary sinuses.  Mild mucosal edema in the nares with scant rhinorrhea.  Bilateral tympanic membranes with normal architecture and good light reflex.  Eyes: Conjunctivae and EOM are normal. Pupils are equal, round, and reactive to light.  Neck: Normal range of motion. Neck supple. No tracheal deviation present.  Shotty anterior cervical lymphadenopathy, minimally tender to palpation  Cardiovascular: Normal rate and regular rhythm.   Pulmonary/Chest: Effort normal and breath sounds normal. No stridor. No respiratory distress. She has no wheezes. She has no rales. She exhibits no tenderness.  Abdominal: Soft. There is no tenderness. There is no rebound and no guarding.  Musculoskeletal: Normal  range of motion.  Lymphadenopathy:    She has cervical adenopathy.  Neurological: She is alert and oriented to person, place, and time.  Skin: Skin is warm and dry.  Psychiatric: She has a normal mood and affect. Her behavior is normal.  Nursing note and vitals reviewed.   ED Treatments / Results  Labs (all labs ordered are listed, but only abnormal results are displayed) Labs Reviewed  RAPID STREP SCREEN (NOT AT [facility])  CULTURE, GROUP A STREP [facility])    EKG  EKG Interpretation None       Radiology No results found.  Procedures Procedures (including critical care time)  DIAGNOSTIC STUDIES: Oxygen Saturation is 99% on room air, normal by my interpretation.    COORDINATION OF CARE: 9:17 PM Discussed treatment plan with pt at bedside and pt agreed to plan.  Medications Ordered in ED Medications  predniSONE (DELTASONE) tablet 60 mg (60 mg Oral Given 11/21/15 2146)  lidocaine (XYLOCAINE) 2 % viscous mouth solution 20 mL (20 mLs Mouth/Throat Given 11/21/15 2146)  ibuprofen (ADVIL,MOTRIN) tablet 400 mg (400 mg Oral Given 11/21/15 2146)     Initial Impression / Assessment and Plan / ED Course  I have reviewed the triage vital signs and the nursing notes.  Pertinent labs & imaging results that were available during my care of the patient were reviewed by me and considered in my medical decision making (see chart for details).  Clinical Course  Value Comment By Time  Streptococcus, Group A Screen (Direct): NEGATIVE (Reviewed) Linna DarnerRussell Turner 08/18 2128    Vitals:   11/21/15 2049  BP: 137/86  Pulse: 94  Resp: 18  Temp: 99.3 F (37.4 C)  TempSrc: Oral  Weight: 112 kg  Height: 5\' 6"  (1.676 m)    Medications  predniSONE (DELTASONE) tablet 60 mg (60 mg Oral Given 11/21/15 2146)  lidocaine (XYLOCAINE) 2 % viscous mouth solution 20 mL (20 mLs Mouth/Throat Given 11/21/15 2146)  ibuprofen (ADVIL,MOTRIN) tablet 400 mg (400 mg Oral Given 11/21/15 2146)    Susa GriffinsLatasha M Petralia is  30 y.o. female presenting with Sore throat, patient is afebrile and overall quite well-appearing. Physical exam with mild tonsillar hypertrophy and scattered small patches of exudate. Rapid strep is negative. Patient will be given prednisone burst, advise Xylocaine and work note provided.  Evaluation does not show pathology that would require ongoing emergent intervention or inpatient treatment. Pt is hemodynamically stable and mentating appropriately. Discussed findings and plan with patient/guardian, who agrees with care plan. All questions answered. Return precautions discussed and outpatient follow up given.    I personally performed the services described in this documentation, which was scribed in my presence. The recorded information has been reviewed and is accurate.   Final Clinical Impressions(s) / ED Diagnoses   Final diagnoses:  Sore throat    New Prescriptions Discharge Medication List as of 11/21/2015  9:49 PM    START taking these medications   Details  lidocaine (XYLOCAINE) 2 % solution Use as directed 15  mLs in the mouth or throat every 6 (six) hours as needed for mouth pain., Starting Fri 11/21/2015, Print    predniSONE (DELTASONE) 50 MG tablet Take 1 tablet daily with breakfast, Print         Wynetta Emeryicole Darline Faith, PA-C 11/21/15 2224    Maia PlanJoshua G Long, MD 11/22/15 0630

## 2015-11-21 NOTE — Discharge Instructions (Signed)
Your strep test today was negative. We were sending it off for more testing, if it comes back as positive we will give you a call next week.  For pain control please take ibuprofen (also known as Motrin or Advil) 800mg  (this is normally 4 over the counter pills) 3 times a day  for 5 days. Take with food to minimize stomach irritation.  Please follow with your primary care doctor in the next 2 days for a check-up. They must obtain records for further management.   Do not hesitate to return to the Emergency Department for any new, worsening or concerning symptoms.

## 2015-11-25 LAB — CULTURE, GROUP A STREP (THRC)

## 2018-02-20 ENCOUNTER — Emergency Department (HOSPITAL_BASED_OUTPATIENT_CLINIC_OR_DEPARTMENT_OTHER): Payer: BLUE CROSS/BLUE SHIELD

## 2018-02-20 ENCOUNTER — Other Ambulatory Visit: Payer: Self-pay

## 2018-02-20 ENCOUNTER — Emergency Department (HOSPITAL_BASED_OUTPATIENT_CLINIC_OR_DEPARTMENT_OTHER)
Admission: EM | Admit: 2018-02-20 | Discharge: 2018-02-20 | Disposition: A | Discharge status: Home / Self Care | Payer: BLUE CROSS/BLUE SHIELD | Attending: Emergency Medicine | Admitting: Emergency Medicine

## 2018-02-20 ENCOUNTER — Encounter (HOSPITAL_BASED_OUTPATIENT_CLINIC_OR_DEPARTMENT_OTHER): Payer: Self-pay

## 2018-02-20 DIAGNOSIS — Z87891 Personal history of nicotine dependence: Secondary | ICD-10-CM | POA: Diagnosis not present

## 2018-02-20 DIAGNOSIS — R0789 Other chest pain: Secondary | ICD-10-CM | POA: Diagnosis not present

## 2018-02-20 DIAGNOSIS — Z79899 Other long term (current) drug therapy: Secondary | ICD-10-CM | POA: Diagnosis not present

## 2018-02-20 DIAGNOSIS — R079 Chest pain, unspecified: Secondary | ICD-10-CM | POA: Diagnosis present

## 2018-02-20 LAB — CBC WITH DIFFERENTIAL/PLATELET
ABS IMMATURE GRANULOCYTES: 0.03 10*3/uL (ref 0.00–0.07)
Basophils Absolute: 0 10*3/uL (ref 0.0–0.1)
Basophils Relative: 0 %
Eosinophils Absolute: 0.3 10*3/uL (ref 0.0–0.5)
Eosinophils Relative: 3 %
HCT: 39.7 % (ref 36.0–46.0)
Hemoglobin: 12.5 g/dL (ref 12.0–15.0)
IMMATURE GRANULOCYTES: 0 %
Lymphocytes Relative: 35 %
Lymphs Abs: 3.5 10*3/uL (ref 0.7–4.0)
MCH: 28.2 pg (ref 26.0–34.0)
MCHC: 31.5 g/dL (ref 30.0–36.0)
MCV: 89.6 fL (ref 80.0–100.0)
Monocytes Absolute: 0.5 10*3/uL (ref 0.1–1.0)
Monocytes Relative: 5 %
NEUTROS ABS: 5.6 10*3/uL (ref 1.7–7.7)
NEUTROS PCT: 57 %
PLATELETS: 364 10*3/uL (ref 150–400)
RBC: 4.43 MIL/uL (ref 3.87–5.11)
RDW: 12.4 % (ref 11.5–15.5)
WBC: 10 10*3/uL (ref 4.0–10.5)
nRBC: 0 % (ref 0.0–0.2)

## 2018-02-20 LAB — BASIC METABOLIC PANEL
Anion gap: 10 (ref 5–15)
BUN: 14 mg/dL (ref 6–20)
CO2: 26 mmol/L (ref 22–32)
Calcium: 9.1 mg/dL (ref 8.9–10.3)
Chloride: 100 mmol/L (ref 98–111)
Creatinine, Ser: 0.59 mg/dL (ref 0.44–1.00)
GFR calc Af Amer: >60 mL/min (ref >60)
GFR calc non Af Amer: >60 mL/min (ref >60)
Glucose, Bld: 91 mg/dL (ref 70–99)
Potassium: 3.5 mmol/L (ref 3.5–5.1)
Sodium: 136 mmol/L (ref 135–145)

## 2018-02-20 LAB — TROPONIN I: Troponin I: <0.03 ng/mL (ref <0.03)

## 2018-02-20 MED ORDER — ASPIRIN 81 MG PO CHEW
324.0000 mg | CHEWABLE_TABLET | Freq: Once | ORAL | Status: AC
  Administered 2018-02-20: 324 mg via ORAL
  Filled 2018-02-20: qty 4

## 2018-02-20 NOTE — ED Notes (Signed)
Patient ambulated around department without difficulty.  Oxygen saturation maintained 96%-100%.

## 2018-02-20 NOTE — ED Provider Notes (Signed)
[facility] EMERGENCY DEPARTMENT Provider Note   CSN: [id number] Arrival date & time: 02/20/18  1518     History   Chief Complaint Chief Complaint  Patient presents with  . Chest Pain    HPI Mary Mills is a 32 y.o. female.  HPI  Patient is a 32 year old female with history of plantar fasciitis, strep throat, who presents the emergency department today complaining of right-sided chest pain that began 3 days ago. Pain is described as sharp/squeezing and it is worse with movement and slightly with inspiration.  Pain is currently rated at 0/10, but increases to 7/10 with movement of her arms. Does not increase with exertion. Pain does not radiate. States that sxs seem "more like an anxiety feeling".  She reports feeling lightheaded when she ambulates intermittently and sometimes has palpitations.  No shortness of breath, nausea, or diaphoresis.   Denies leg pain/swelling, hemoptysis, recent surgery/trauma, recent long travel, hormone use, personal hx of cancer, or hx of DVT/PE.   Denies h/o HTN, HLD, T2DM. Does report tobacco use. No early family history of heart disease or sudden early cardiac death.   Past Medical History:  Diagnosis Date  . Plantar fasciitis, bilateral   . Strep throat     There are no active problems to display for this patient.   Past Surgical History:  Procedure Laterality Date  . CESAREAN SECTION    . TUBAL LIGATION       OB History   None      Home Medications    Prior to Admission medications   Medication Sig Start Date End Date Taking? Authorizing Provider  lidocaine (XYLOCAINE) 2 % solution Use as directed 15 mLs in the mouth or throat every 6 (six) hours as needed for mouth pain. 11/21/15   Pisciotta, Joni Reining, PA-C  naproxen (NAPROSYN) 500 MG tablet Take 500 mg by mouth 2 (two) times daily with a meal.    [provider]  predniSONE (DELTASONE) 50 MG tablet Take 1 tablet daily with breakfast 11/21/15   Pisciotta,  Joni Reining, PA-C    Family History No family history on file.  Social History Social History   Tobacco Use  . Smoking status: Former Smoker    Packs/day: 0.50    Types: Cigarettes  . Smokeless tobacco: Never Used  Substance Use Topics  . Alcohol use: No  . Drug use: No     Allergies   Patient has no known allergies.   Review of Systems Review of Systems  Constitutional: Negative for chills and fever.  HENT: Negative for ear pain and sore throat.   Eyes: Negative for pain and visual disturbance.  Respiratory: Negative for cough and shortness of breath.   Cardiovascular: Positive for chest pain and palpitations. Negative for leg swelling.  Gastrointestinal: Negative for abdominal pain and vomiting.  Genitourinary: Negative for dysuria and hematuria.  Musculoskeletal: Negative for arthralgias and back pain.  Skin: Negative for color change and rash.  Neurological: Negative for headaches.  All other systems reviewed and are negative.    Physical Exam Updated Vital Signs BP 136/85 (BP Location: Right Arm)   Pulse 93   Temp 98.4 F (36.9 C) (Oral)   Resp 20   Ht 5\' 5"  (1.651 m)   Wt 107 kg   LMP 02/13/2018   SpO2 99%   BMI 39.27 kg/m   Physical Exam  Constitutional: She appears well-developed and well-nourished. She does not appear ill. No distress.  Watching videos on phone when  I enter the room   HENT:  Head: Normocephalic and atraumatic.  Eyes: Conjunctivae are normal.  Neck: Neck supple.  Cardiovascular: Normal rate, regular rhythm, intact distal pulses and normal pulses.  No murmur heard. Pulmonary/Chest: Effort normal and breath sounds normal. No respiratory distress. She has no decreased breath sounds. She has no wheezes. She has no rhonchi. She has no rales.  Right sided chest wall TTP that reproduces pain. No overlying skin changes or rashes.  Abdominal: Soft. Bowel sounds are normal. She exhibits no distension. There is no tenderness.    Musculoskeletal: She exhibits no edema.       Right lower leg: Normal. She exhibits no tenderness and no edema.       Left lower leg: Normal. She exhibits no tenderness and no edema.  Neurological: She is alert.  Skin: Skin is warm and dry.  Psychiatric: She has a normal mood and affect.  Nursing note and vitals reviewed.   ED Treatments / Results  Labs (all labs ordered are listed, but only abnormal results are displayed) Labs Reviewed  CBC WITH DIFFERENTIAL/PLATELET  BASIC METABOLIC PANEL  TROPONIN I    EKG EKG Interpretation  Date/Time:  Monday February 20 2018 15:30:28 EST Ventricular Rate:  93 PR Interval:  166 QRS Duration: 86 QT Interval:  360 QTC Calculation: 447 R Axis:   39 Text Interpretation:  Normal sinus rhythm Confirmed by Virgina Norfolk [phone]) on 02/20/2018 6:53:25 PM   Radiology Dg Chest 2 View  Result Date: 02/20/2018 CLINICAL DATA:  RIGHT side chest pain since Friday, smoker EXAM: CHEST - 2 VIEW COMPARISON:  None FINDINGS: Upper normal heart size. Mediastinal contours and pulmonary vascularity normal. Lungs clear. No infiltrate, pleural effusion or pneumothorax. Bones unremarkable. IMPRESSION: No acute abnormalities. Electronically Signed   By: Ulyses Southward M.D.   On: 02/20/2018 17:54    Procedures Procedures (including critical care time)  Medications Ordered in ED Medications  aspirin chewable tablet 324 mg (324 mg Oral Given 02/20/18 1742)     Initial Impression / Assessment and Plan / ED Course  I have reviewed the triage vital signs and the nursing notes.  Pertinent labs & imaging results that were available during my care of the patient were reviewed by me and considered in my medical decision making (see chart for details).     Final Clinical Impressions(s) / ED Diagnoses   Final diagnoses:  Atypical chest pain   Patient presenting with right-sided chest pain that occurs with movement of her arms.  Does not occur with exertion.   Pain does not radiate and is not associated with shortness of breath, diaphoresis.  Patient has no risk factors for PE/DVT and she is PERC negative and was able to maintain sats with ambulation. she is also low risk for ACS and has heart score of 2 based on risk factors of smoking and obesity.  Her story certainly sounds atypical for ACS.  History sounds most consistent with musculoskeletal cause given that her pain is reproducible on exam and is worsened with movement of her arms.  She has no overlying skin changes to the chest to suggest shingles infection.  Doubt AAA as no widened mediastinum on chest x-ray.  Chest x-ray also shows no pneumothorax or pneumonia.  EKG is without ischemic changes.  Normal heart rate.  No arrhythmia.  Labs are reassuring no elevated white blood cell count, no anemia.  Electrolytes are within normal limits.  Kidney function is normal and troponin is negative.  Have low suspicion for acute cardiopulmonary etiology at this time and feel that patient is appropriate for outpatient follow-up.  Have advised her to return to the ER for any new or worsening symptoms in the meantime.  She voices understanding the plan reasons to return to the ED.  All questions answered.  ED Discharge Orders    None       Karrie MeresCouture, Jorian Willhoite S, PA-C 02/20/18 1923    Virgina NorfolkCuratolo, Adam, DO 02/21/18 [id number]

## 2018-02-20 NOTE — Discharge Instructions (Signed)

## 2018-02-20 NOTE — ED Triage Notes (Addendum)
Pt c/o R sided CP that started 3 days ago. Pain is described as sharp and is worse with inspiration and movement. Pt denies ShOB and diaphoresis, but states she has been nauseated. Pt also c/o intermittent light headedness upon standing or bending over.

## 2018-05-12 ENCOUNTER — Encounter (HOSPITAL_BASED_OUTPATIENT_CLINIC_OR_DEPARTMENT_OTHER): Payer: Self-pay | Admitting: Emergency Medicine

## 2018-05-12 ENCOUNTER — Other Ambulatory Visit: Payer: Self-pay

## 2018-05-12 ENCOUNTER — Emergency Department (HOSPITAL_BASED_OUTPATIENT_CLINIC_OR_DEPARTMENT_OTHER)
Admission: EM | Admit: 2018-05-12 | Discharge: 2018-05-12 | Disposition: A | Discharge status: Home / Self Care | Payer: BLUE CROSS/BLUE SHIELD | Attending: Emergency Medicine | Admitting: Emergency Medicine

## 2018-05-12 DIAGNOSIS — R42 Dizziness and giddiness: Secondary | ICD-10-CM | POA: Insufficient documentation

## 2018-05-12 DIAGNOSIS — R110 Nausea: Secondary | ICD-10-CM | POA: Diagnosis not present

## 2018-05-12 DIAGNOSIS — Z87891 Personal history of nicotine dependence: Secondary | ICD-10-CM | POA: Diagnosis not present

## 2018-05-12 DIAGNOSIS — R109 Unspecified abdominal pain: Secondary | ICD-10-CM | POA: Insufficient documentation

## 2018-05-12 DIAGNOSIS — R1030 Lower abdominal pain, unspecified: Secondary | ICD-10-CM | POA: Diagnosis present

## 2018-05-12 LAB — URINALYSIS, ROUTINE W REFLEX MICROSCOPIC
BILIRUBIN URINE: NEGATIVE
Glucose, UA: NEGATIVE mg/dL
HGB URINE DIPSTICK: NEGATIVE
Ketones, ur: NEGATIVE mg/dL
NITRITE: NEGATIVE
PROTEIN: NEGATIVE mg/dL
Specific Gravity, Urine: >1.03 — ABNORMAL HIGH (ref 1.005–1.030)
pH: 5.5 (ref 5.0–8.0)

## 2018-05-12 LAB — URINALYSIS, MICROSCOPIC (REFLEX)

## 2018-05-12 LAB — COMPREHENSIVE METABOLIC PANEL
ALBUMIN: 3.8 g/dL (ref 3.5–5.0)
ALK PHOS: 58 U/L (ref 38–126)
ALT: 15 U/L (ref 0–44)
ANION GAP: 5 (ref 5–15)
AST: 16 U/L (ref 15–41)
BILIRUBIN TOTAL: 0.2 mg/dL — AB (ref 0.3–1.2)
BUN: 10 mg/dL (ref 6–20)
CALCIUM: 8.8 mg/dL — AB (ref 8.9–10.3)
CO2: 25 mmol/L (ref 22–32)
Chloride: 105 mmol/L (ref 98–111)
Creatinine, Ser: 0.55 mg/dL (ref 0.44–1.00)
GFR calc Af Amer: >60 mL/min (ref >60)
Glucose, Bld: 87 mg/dL (ref 70–99)
Potassium: 3.5 mmol/L (ref 3.5–5.1)
Sodium: 135 mmol/L (ref 135–145)
TOTAL PROTEIN: 7.5 g/dL (ref 6.5–8.1)

## 2018-05-12 LAB — CBC
HCT: 38.7 % (ref 36.0–46.0)
Hemoglobin: 12.1 g/dL (ref 12.0–15.0)
MCH: 28 pg (ref 26.0–34.0)
MCHC: 31.3 g/dL (ref 30.0–36.0)
MCV: 89.6 fL (ref 80.0–100.0)
PLATELETS: 313 10*3/uL (ref 150–400)
RBC: 4.32 MIL/uL (ref 3.87–5.11)
RDW: 12.8 % (ref 11.5–15.5)
WBC: 8.4 10*3/uL (ref 4.0–10.5)
nRBC: 0 % (ref 0.0–0.2)

## 2018-05-12 LAB — PREGNANCY, URINE: PREG TEST UR: NEGATIVE

## 2018-05-12 LAB — LIPASE, BLOOD: Lipase: 27 U/L (ref 11–51)

## 2018-05-12 MED ORDER — SODIUM CHLORIDE 0.9% FLUSH
3.0000 mL | Freq: Once | INTRAVENOUS | Status: DC
  Filled 2018-05-12: qty 3

## 2018-05-12 MED ORDER — ONDANSETRON 4 MG PO TBDP: 4.0000 mg | ORAL_TABLET | Freq: Three times a day (TID) | ORAL | 0 refills | Status: DC | PRN

## 2018-05-12 NOTE — Discharge Instructions (Signed)
Please read and follow all provided instructions.  Your diagnoses today include:  1. Right sided abdominal pain   2. Nausea     Tests performed today include:  Blood counts and electrolytes  Blood tests to check liver and kidney function  Blood tests to check pancreas function  Urine test to look for infection and pregnancy (in women) - no pregnancy, a few white blood cells in the urine  Vital signs. See below for your results today.   Medications prescribed:   Zofran (ondansetron) - for nausea and vomiting  Take any prescribed medications only as directed.  Home care instructions:   Follow any educational materials contained in this packet.  Follow-up instructions: Please follow-up with your primary care provider in the next 3 days for further evaluation of your symptoms.    Return instructions:  SEEK IMMEDIATE MEDICAL ATTENTION IF:  The pain does not go away or becomes severe   A temperature above 101F develops   Repeated vomiting occurs (multiple episodes)   The pain becomes localized to portions of the abdomen. The right side could possibly be appendicitis. In an adult, the left lower portion of the abdomen could be colitis or diverticulitis.   Blood is being passed in stools or vomit (bright red or black tarry stools)   You develop chest pain, difficulty breathing, dizziness or fainting, or become confused, poorly responsive, or inconsolable (young children)  If you have any other emergent concerns regarding your health  Additional Information: Abdominal (belly) pain can be caused by many things. Your caregiver performed an examination and possibly ordered blood/urine tests and imaging (CT scan, x-rays, ultrasound). Many cases can be observed and treated at home after initial evaluation in the emergency department. Even though you are being discharged home, abdominal pain can be unpredictable. Therefore, you need a repeated exam if your pain does not resolve,  returns, or worsens. Most patients with abdominal pain don't have to be admitted to the hospital or have surgery, but serious problems like appendicitis and gallbladder attacks can start out as nonspecific pain. Many abdominal conditions cannot be diagnosed in one visit, so follow-up evaluations are very important.  Your vital signs today were: BP 135/75 (BP Location: Right Arm)    Pulse 90    Temp 98.5 F (36.9 C) (Oral)    Resp 18    Ht 5\' 5"  (1.651 m)    Wt 113.4 kg    LMP 04/25/2018 (Approximate)    SpO2 100%    BMI 41.60 kg/m  If your blood pressure (bp) was elevated above 135/85 this visit, please have this repeated by your doctor within one month. --------------

## 2018-05-12 NOTE — ED Triage Notes (Signed)
Reports pelvic and abdominal pain with nausea.  Denies vomiting, diarrhea, vaginal discharge, odor, dysuria.

## 2018-05-12 NOTE — ED Provider Notes (Signed)
[facility] EMERGENCY DEPARTMENT Provider Note   CSN: [id number] Arrival date & time: 05/12/18  [id number]     History   Chief Complaint Chief Complaint  Patient presents with  . Abdominal Pain    HPI Mary Mills is a 33 y.o. female.  Patient with history of tubal ligation, cesarean section x2 presents to the emergency department today with complaint of lower abdominal pain with nausea.  Patient states that she has felt lightheaded and nauseous over the past several weeks.  She had these symptoms previously with pregnancy and went to her OB/GYN who checked a pregnancy test that was negative.  Symptoms became worse this morning with development of abdominal pain.  She does not have any associated fevers, vomiting, or diarrhea.  She denies dysuria, hematuria, increased frequency or urgency.  She denies vaginal bleeding or discharge.  Her last menstrual period was about 3 weeks ago.  She typically has heavy bleeding.  This period was normal for her.  No treatments prior to arrival.  Onset of symptoms acute.  Course is constant.     Past Medical History:  Diagnosis Date  . Plantar fasciitis, bilateral   . Strep throat     There are no active problems to display for this patient.   Past Surgical History:  Procedure Laterality Date  . CESAREAN SECTION    . TUBAL LIGATION       OB History   No obstetric history on file.      Home Medications    Prior to Admission medications   Medication Sig Start Date End Date Taking? Authorizing Provider  lidocaine (XYLOCAINE) 2 % solution Use as directed 15 mLs in the mouth or throat every 6 (six) hours as needed for mouth pain. 11/21/15   Pisciotta, Joni ReiningNicole, PA-C  naproxen (NAPROSYN) 500 MG tablet Take 500 mg by mouth 2 (two) times daily with a meal.    [provider]  predniSONE (DELTASONE) 50 MG tablet Take 1 tablet daily with breakfast 11/21/15   Pisciotta, Mardella LaymanNicole, PA-C    Family History History reviewed. No  pertinent family history.  Social History Social History   Tobacco Use  . Smoking status: Former Smoker    Packs/day: 0.50    Types: Cigarettes  . Smokeless tobacco: Never Used  Substance Use Topics  . Alcohol use: No  . Drug use: No     Allergies   Patient has no known allergies.   Review of Systems Review of Systems  Constitutional: Negative for fever.  HENT: Negative for rhinorrhea and sore throat.   Eyes: Negative for redness.  Respiratory: Negative for cough.   Cardiovascular: Negative for chest pain.  Gastrointestinal: Positive for abdominal pain and nausea. Negative for diarrhea and vomiting.  Genitourinary: Negative for dysuria, vaginal bleeding and vaginal pain.  Musculoskeletal: Negative for myalgias.  Skin: Negative for rash.  Neurological: Positive for light-headedness. Negative for headaches.     Physical Exam Updated Vital Signs BP 135/75 (BP Location: Right Arm)   Pulse 90   Temp 98.5 F (36.9 C) (Oral)   Resp 18   Ht 5\' 5"  (1.651 m)   Wt 113.4 kg   LMP 04/25/2018 (Approximate)   SpO2 100%   BMI 41.60 kg/m   Physical Exam Vitals signs and nursing note reviewed.  Constitutional:      Appearance: She is well-developed.  HENT:     Head: Normocephalic and atraumatic.  Eyes:     General:  Right eye: No discharge.        Left eye: No discharge.     Conjunctiva/sclera: Conjunctivae normal.  Neck:     Musculoskeletal: Normal range of motion and neck supple.  Cardiovascular:     Rate and Rhythm: Normal rate and regular rhythm.     Heart sounds: Normal heart sounds.  Pulmonary:     Effort: Pulmonary effort is normal.     Breath sounds: Normal breath sounds.  Abdominal:     Palpations: Abdomen is soft.     Tenderness: There is abdominal tenderness in the right upper quadrant and right lower quadrant. There is no right CVA tenderness, left CVA tenderness, guarding or rebound. Negative signs include Murphy's sign, Rovsing's sign and  McBurney's sign.  Skin:    General: Skin is warm and dry.  Neurological:     Mental Status: She is alert.      ED Treatments / Results  Labs (all labs ordered are listed, but only abnormal results are displayed) Labs Reviewed  COMPREHENSIVE METABOLIC PANEL - Abnormal; Notable for the following components:      Result Value   Calcium 8.8 (*)    Total Bilirubin 0.2 (*)    All other components within normal limits  URINALYSIS, ROUTINE W REFLEX MICROSCOPIC - Abnormal; Notable for the following components:   Specific Gravity, Urine >1.030 (*)    Leukocytes, UA TRACE (*)    All other components within normal limits  URINALYSIS, MICROSCOPIC (REFLEX) - Abnormal; Notable for the following components:   Bacteria, UA FEW (*)    All other components within normal limits  LIPASE, BLOOD  CBC  PREGNANCY, URINE    EKG None  Radiology No results found.  Procedures Procedures (including critical care time)  Medications Ordered in ED Medications  sodium chloride flush (NS) 0.9 % injection 3 mL (3 mLs Intravenous Not Given 05/12/18 1047)     Initial Impression / Assessment and Plan / ED Course  I have reviewed the triage vital signs and the nursing notes.  Pertinent labs & imaging results that were available during my care of the patient were reviewed by me and considered in my medical decision making (see chart for details).     Patient seen and examined.  Patient is mildly tender to palpation on the right side of the abdomen, no significant symptoms on the left.  She does not have any focal tenderness in the right upper quadrant or at McBurney's point however.  Labs ordered including urine.  Will reassess.  Patient does not have any focal pelvic pain and no vaginal symptoms at this time.   Vital signs reviewed and are as follows: BP 135/75 (BP Location: Right Arm)   Pulse 90   Temp 98.5 F (36.9 C) (Oral)   Resp 18   Ht 5\' 5"  (1.651 m)   Wt 113.4 kg   LMP 04/25/2018  (Approximate)   SpO2 100%   BMI 41.60 kg/m   11:43 AM patient reexamined.  She states that her pain has improved while being in the emergency department.  On reexam she has minimal pain in the right upper, right lateral, and right lower quadrants.  Again pain is not focal.  It is mild.  We discussed all lab results.  We discussed the few white blood cells are rare in urine, however symptoms are not consistent with urinary tract infection today.  She will monitor for any symptoms and we will defer treatment at this time.  Discussed  that abdominal pain can evolve and change and if her symptoms become more worrisome she should return the emergency department or see her doctor for further work-up.  The patient was urged to return to the Emergency Department immediately with worsening of current symptoms, worsening abdominal pain, persistent vomiting, blood noted in stools, fever, or any other concerns. The patient verbalized understanding.    Final Clinical Impressions(s) / ED Diagnoses   Final diagnoses:  Right sided abdominal pain  Nausea   Patient with mild right-sided abdominal pain, nonfocal with associated nausea.  No associated vomiting, diarrhea, fevers, blood in stool.  Exam with minimal tenderness on the right side. Vitals are stable, no fever. Labs with normal white blood cell count, normal liver and kidney function tests.  Patient has a few white blood cells in her urine however she does not have symptoms consistent with cystitis or pyelonephritis.  Imaging not felt indicated today. No signs of dehydration, patient is tolerating PO's. Lungs are clear and no signs suggestive of PNA. Low concern for appendicitis, cholecystitis, pancreatitis, ruptured viscus, UTI, kidney stone, aortic dissection, aortic aneurysm or other emergent abdominal etiology. Supportive therapy indicated with return if symptoms worsen.    ED Discharge Orders         Ordered    ondansetron (ZOFRAN ODT) 4 MG  disintegrating tablet  Every 8 hours PRN     05/12/18 1142           Renne CriglerGeiple, Stonewall Doss, PA-C 05/12/18 1145    Gwyneth SproutPlunkett, Whitney, MD 05/14/18 [phone]

## 2018-09-03 ENCOUNTER — Encounter (HOSPITAL_BASED_OUTPATIENT_CLINIC_OR_DEPARTMENT_OTHER): Payer: Self-pay | Admitting: *Deleted

## 2018-09-03 ENCOUNTER — Other Ambulatory Visit: Payer: Self-pay

## 2018-09-03 ENCOUNTER — Emergency Department (HOSPITAL_BASED_OUTPATIENT_CLINIC_OR_DEPARTMENT_OTHER)
Admission: EM | Admit: 2018-09-03 | Discharge: 2018-09-03 | Disposition: A | Discharge status: Home / Self Care | Payer: BLUE CROSS/BLUE SHIELD | Attending: Emergency Medicine | Admitting: Emergency Medicine

## 2018-09-03 DIAGNOSIS — Z79899 Other long term (current) drug therapy: Secondary | ICD-10-CM | POA: Diagnosis not present

## 2018-09-03 DIAGNOSIS — J020 Streptococcal pharyngitis: Secondary | ICD-10-CM | POA: Diagnosis not present

## 2018-09-03 DIAGNOSIS — F17210 Nicotine dependence, cigarettes, uncomplicated: Secondary | ICD-10-CM | POA: Insufficient documentation

## 2018-09-03 DIAGNOSIS — J029 Acute pharyngitis, unspecified: Secondary | ICD-10-CM | POA: Diagnosis present

## 2018-09-03 LAB — GROUP A STREP BY PCR: Group A Strep by PCR: DETECTED — AB

## 2018-09-03 MED ORDER — PENICILLIN G BENZATHINE 1200000 UNIT/2ML IM SUSP
1.2000 10*6.[IU] | Freq: Once | INTRAMUSCULAR | Status: AC
  Administered 2018-09-03: 20:00:00 1.2 10*6.[IU] via INTRAMUSCULAR
  Filled 2018-09-03: qty 2

## 2018-09-03 MED ORDER — DEXAMETHASONE SODIUM PHOSPHATE 10 MG/ML IJ SOLN
10.0000 mg | Freq: Once | INTRAMUSCULAR | Status: AC
  Administered 2018-09-03: 20:00:00 10 mg via INTRAMUSCULAR
  Filled 2018-09-03: qty 1

## 2018-09-03 NOTE — ED Provider Notes (Signed)
[facility] EMERGENCY DEPARTMENT Provider Note   CSN: [id number] Arrival date & time: 09/03/18  1837    History   Chief Complaint Chief Complaint  Patient presents with  . Sore Throat    HPI Mary Mills is a 33 y.o. female with a history of tobacco abuse and prior tubal ligation who presents to the emergency department with complaints of sore throat that began last night.  Pain is constant, moderate in severity, worse with swallowing but she is able to swallow.  Reports associated fever with temp max of 102 at home, treated with Motrin with improvement.  Had one episode of vomiting last evening but is been able to tolerate p.o. throughout the day today.  States this feels similar to prior strep throat.  No positive sick contacts.  No COVID-19 exposures that she is aware of.  Denies congestion, ear pain, cough, shortness of breath, abdominal pain, or diarrhea.  Denies chance of pregnancy.     HPI  Past Medical History:  Diagnosis Date  . Plantar fasciitis, bilateral   . Strep throat     There are no active problems to display for this patient.   Past Surgical History:  Procedure Laterality Date  . CESAREAN SECTION    . TUBAL LIGATION       OB History   No obstetric history on file.      Home Medications    Prior to Admission medications   Medication Sig Start Date End Date Taking? Authorizing Provider  lidocaine (XYLOCAINE) 2 % solution Use as directed 15 mLs in the mouth or throat every 6 (six) hours as needed for mouth pain. 11/21/15   Pisciotta, Joni ReiningNicole, PA-C  naproxen (NAPROSYN) 500 MG tablet Take 500 mg by mouth 2 (two) times daily with a meal.    [provider]  ondansetron (ZOFRAN ODT) 4 MG disintegrating tablet Take 1 tablet (4 mg total) by mouth every 8 (eight) hours as needed for nausea or vomiting. 05/12/18   Renne CriglerGeiple, Joshua, PA-C  predniSONE (DELTASONE) 50 MG tablet Take 1 tablet daily with breakfast 11/21/15   Pisciotta, Joni ReiningNicole, PA-C     Family History No family history on file.  Social History Social History   Tobacco Use  . Smoking status: Current Some Day Smoker    Packs/day: 0.50    Types: Cigarettes  . Smokeless tobacco: Never Used  Substance Use Topics  . Alcohol use: No  . Drug use: No     Allergies   Patient has no known allergies.   Review of Systems Review of Systems  Constitutional: Positive for fever.  HENT: Positive for sore throat and trouble swallowing (painful but able). Negative for congestion, drooling, ear pain and voice change.   Respiratory: Negative for cough and shortness of breath.   Gastrointestinal: Positive for vomiting (x1). Negative for abdominal pain, constipation and diarrhea.  Genitourinary: Negative for dysuria.     Physical Exam Updated Vital Signs BP 118/70 (BP Location: Right Arm)   Pulse (!) 111   Temp 98.7 F (37.1 C) (Oral)   Resp 18   Ht 5\' 5"  (1.651 m)   Wt 104.3 kg   LMP 08/20/2018 (Approximate)   SpO2 100%   BMI 38.27 kg/m   Physical Exam Vitals signs and nursing note reviewed.  Constitutional:      General: She is not in acute distress.    Appearance: She is well-developed.  HENT:     Head: Normocephalic and atraumatic.  Right Ear: Ear canal normal. Tympanic membrane is not perforated, erythematous, retracted or bulging.     Left Ear: Ear canal normal. Tympanic membrane is not perforated, erythematous, retracted or bulging.     Ears:     Comments: No mastoid erythema/swelling/tenderness.     Nose:     Right Sinus: No maxillary sinus tenderness or frontal sinus tenderness.     Left Sinus: No maxillary sinus tenderness or frontal sinus tenderness.     Mouth/Throat:     Pharynx: Uvula midline. Posterior oropharyngeal erythema present.     Tonsils: Tonsillar exudate present. 2+ on the right. 2+ on the left.     Comments: Posterior oropharynx is symmetric appearing. Patient tolerating own secretions without difficulty. No trismus. No drooling.  No hot potato voice. No swelling beneath the tongue, submandibular compartment is soft.  Eyes:     General:        Right eye: No discharge.        Left eye: No discharge.     Conjunctiva/sclera: Conjunctivae normal.     Pupils: Pupils are equal, round, and reactive to light.  Neck:     Musculoskeletal: Normal range of motion and neck supple. No edema or neck rigidity.  Cardiovascular:     Rate and Rhythm: Regular rhythm. Tachycardia present.     Heart sounds: No murmur.  Pulmonary:     Effort: Pulmonary effort is normal. No respiratory distress.     Breath sounds: Normal breath sounds. No wheezing, rhonchi or rales.  Abdominal:     General: There is no distension.     Palpations: Abdomen is soft.     Tenderness: There is no abdominal tenderness.  Lymphadenopathy:     Cervical: Cervical adenopathy (anterior) present.  Skin:    General: Skin is warm and dry.     Findings: No rash.  Neurological:     Mental Status: She is alert.  Psychiatric:        Behavior: Behavior normal.     ED Treatments / Results  Labs (all labs ordered are listed, but only abnormal results are displayed) Labs Reviewed  GROUP A STREP BY PCR - Abnormal; Notable for the following components:      Result Value   Group A Strep by PCR DETECTED (*)    All other components within normal limits    EKG None  Radiology No results found.  Procedures Procedures (including critical care time)  Medications Ordered in ED Medications  penicillin g benzathine (BICILLIN LA) 1200000 UNIT/2ML injection 1.2 Million Units (has no administration in time range)  dexamethasone (DECADRON) injection 10 mg (has no administration in time range)     Initial Impression / Assessment and Plan / ED Course  I have reviewed the triage vital signs and the nursing notes.  Pertinent labs & imaging results that were available during my care of the patient were reviewed by me and considered in my medical decision making (see  chart for details).    Presents with complaint of sore throat.  Patient is nontoxic-appearing, vitals are within normal limits other than mild tachycardia.  On exam patient with tonsillar erythema, exudates, and anterior cervical lymphadenopathy.  strep test is positive.  Treated in the emergency department with IM Decadron and IM Penicillin.  Exam non concerning for PTA or RPA, there is no trismus, uvular deviation, or hot potato voice. Patient is tolerating his own secretions without difficulty, full ROM of the neck, submandibular compartment is soft. 1 episode  of emesis last evening, but has since been able to tolerate PO which is reassuring, do not feel further labs/investigation of this are necessary at this time. Recommended use of Tylenol and Ibuprofen for any continued discomfort or fevers. Stressed importance of good hydration. I discussed results, treatment plan, need for PCP follow-up, and return precautions with the patient. Provided opportunity for questions, patient confirmed understanding and is in agreement with plan.     Final Clinical Impressions(s) / ED Diagnoses   Final diagnoses:  Strep pharyngitis    ED Discharge Orders    None       Cherly Anderson, PA-C 09/03/18 2013    Virgina Norfolk, DO 09/03/18 2136

## 2018-09-03 NOTE — ED Triage Notes (Addendum)
Sore throat since yesterday. Fever 102.7 last night. Vomited x 1 last night. She took ibuprofen at 1pm today

## 2018-09-03 NOTE — Discharge Instructions (Addendum)

## 2018-09-21 ENCOUNTER — Other Ambulatory Visit: Payer: Self-pay

## 2018-09-21 ENCOUNTER — Encounter (HOSPITAL_BASED_OUTPATIENT_CLINIC_OR_DEPARTMENT_OTHER): Payer: Self-pay | Admitting: Emergency Medicine

## 2018-09-21 ENCOUNTER — Emergency Department (HOSPITAL_BASED_OUTPATIENT_CLINIC_OR_DEPARTMENT_OTHER)
Admission: EM | Admit: 2018-09-21 | Discharge: 2018-09-21 | Disposition: A | Discharge status: Home / Self Care | Payer: BC Managed Care – PPO | Attending: Emergency Medicine | Admitting: Emergency Medicine

## 2018-09-21 DIAGNOSIS — J020 Streptococcal pharyngitis: Secondary | ICD-10-CM

## 2018-09-21 DIAGNOSIS — F17210 Nicotine dependence, cigarettes, uncomplicated: Secondary | ICD-10-CM | POA: Diagnosis not present

## 2018-09-21 DIAGNOSIS — J029 Acute pharyngitis, unspecified: Secondary | ICD-10-CM | POA: Diagnosis present

## 2018-09-21 LAB — GROUP A STREP BY PCR: Group A Strep by PCR: DETECTED — AB

## 2018-09-21 MED ORDER — AMOXICILLIN 500 MG PO CAPS: 500.0000 mg | ORAL_CAPSULE | Freq: Two times a day (BID) | ORAL | 0 refills | Status: AC

## 2018-09-21 NOTE — ED Provider Notes (Signed)
[facility] EMERGENCY DEPARTMENT Provider Note   CSN: [id number] Arrival date & time: 09/21/18  7564     History   Chief Complaint Chief Complaint  Patient presents with  . Sore Throat    HPI Mary Mills is a 33 y.o. female.     The history is provided by the patient. No language interpreter was used.  Sore Throat   Mary Mills is a 33 y.o. female who presents to the Emergency Department complaining of sore throat. Presents to the emergency department complaining of sore throat that began yesterday. She scribes mild aching in her throat, right greater than left. She denies any fevers, nausea, vomiting. She has a history of strep pharyngitis that normally affects her left tonsil, which is always larger than her right. Now her symptoms are on the right side. She denies any difficulty breathing. No additional symptoms. She has no medical problems and takes no prescription medications. She denies any current chance of pregnancy. Past Medical History:  Diagnosis Date  . Plantar fasciitis, bilateral   . Strep throat     There are no active problems to display for this patient.   Past Surgical History:  Procedure Laterality Date  . CESAREAN SECTION    . TUBAL LIGATION       OB History   No obstetric history on file.      Home Medications    Prior to Admission medications   Medication Sig Start Date End Date Taking? Authorizing Provider  amoxicillin (AMOXIL) 500 MG capsule Take 1 capsule (500 mg total) by mouth 2 (two) times daily. 09/21/18   Quintella Reichert, MD  lidocaine (XYLOCAINE) 2 % solution Use as directed 15 mLs in the mouth or throat every 6 (six) hours as needed for mouth pain. 11/21/15   Pisciotta, Elmyra Ricks, PA-C  naproxen (NAPROSYN) 500 MG tablet Take 500 mg by mouth 2 (two) times daily with a meal.    [provider]  ondansetron (ZOFRAN ODT) 4 MG disintegrating tablet Take 1 tablet (4 mg total) by mouth every 8 (eight) hours as needed  for nausea or vomiting. 05/12/18   Carlisle Cater, PA-C  predniSONE (DELTASONE) 50 MG tablet Take 1 tablet daily with breakfast 11/21/15   Pisciotta, Elmyra Ricks, PA-C    Family History No family history on file.  Social History Social History   Tobacco Use  . Smoking status: Current Some Day Smoker    Packs/day: 0.50    Types: Cigarettes  . Smokeless tobacco: Never Used  Substance Use Topics  . Alcohol use: No  . Drug use: No     Allergies   Patient has no known allergies.   Review of Systems Review of Systems  All other systems reviewed and are negative.    Physical Exam Updated Vital Signs BP 132/88 (BP Location: Right Arm)   Pulse 97   Temp 98.2 F (36.8 C) (Oral)   Resp 14   Ht 5\' 5"  (1.651 m)   Wt 104.3 kg   LMP 09/19/2018   SpO2 99%   BMI 38.27 kg/m   Physical Exam Vitals signs and nursing note reviewed.  Constitutional:      Appearance: She is well-developed.  HENT:     Head: Normocephalic and atraumatic.     Nose: No rhinorrhea.     Mouth/Throat:     Mouth: Mucous membranes are moist.     Comments: Mild to moderate erythema in the posterior oropharynx. No exudates. No PTA. Left tonsil  is slightly more full than the right tonsil. Eyes:     Conjunctiva/sclera: Conjunctivae normal.  Neck:     Musculoskeletal: Neck supple.  Cardiovascular:     Rate and Rhythm: Normal rate and regular rhythm.  Pulmonary:     Effort: Pulmonary effort is normal. No respiratory distress.  Musculoskeletal:        General: No tenderness.  Lymphadenopathy:     Cervical: No cervical adenopathy.  Skin:    General: Skin is warm and dry.  Neurological:     Mental Status: She is alert and oriented to person, place, and time.     Comments: No asymmetry of facial muscles.  Psychiatric:        Behavior: Behavior normal.      ED Treatments / Results  Labs (all labs ordered are listed, but only abnormal results are displayed) Labs Reviewed  GROUP A STREP BY PCR -  Abnormal; Notable for the following components:      Result Value   Group A Strep by PCR DETECTED (*)    All other components within normal limits    EKG    Radiology No results found.  Procedures Procedures (including critical care time)  Medications Ordered in ED Medications - No data to display   Initial Impression / Assessment and Plan / ED Course  I have reviewed the triage vital signs and the nursing notes.  Pertinent labs & imaging results that were available during my care of the patient were reviewed by me and considered in my medical decision making (see chart for details).       Patient here for evaluation of sore throat that began yesterday. She is non-toxic appearing on evaluation. She does have asymmetric tonsils, but she states this is chronic in nature. There is no evidence of PTA, RPA, epiglotitis. Strep PCR is positive. Discussed with patient home care for strep pharyngitis. Discussed pain control, outpatient follow-up and return precautions. Final Clinical Impressions(s) / ED Diagnoses   Final diagnoses:  Strep pharyngitis    ED Discharge Orders         Ordered    amoxicillin (AMOXIL) 500 MG capsule  2 times daily     09/21/18 0916           Tilden Fossaees, Kemarion Abbey, MD 09/21/18 [phone]

## 2018-09-21 NOTE — ED Triage Notes (Signed)
Sore throat returned yesterday, much worse this am.  Noticed swollen red tonsils with small white spots.

## 2019-03-26 ENCOUNTER — Emergency Department (HOSPITAL_BASED_OUTPATIENT_CLINIC_OR_DEPARTMENT_OTHER)
Admission: EM | Admit: 2019-03-26 | Discharge: 2019-03-27 | Disposition: A | Discharge status: Home / Self Care | Payer: BC Managed Care – PPO | Attending: Emergency Medicine | Admitting: Emergency Medicine

## 2019-03-26 ENCOUNTER — Other Ambulatory Visit: Payer: Self-pay

## 2019-03-26 ENCOUNTER — Encounter (HOSPITAL_BASED_OUTPATIENT_CLINIC_OR_DEPARTMENT_OTHER): Payer: Self-pay | Admitting: *Deleted

## 2019-03-26 DIAGNOSIS — J039 Acute tonsillitis, unspecified: Secondary | ICD-10-CM | POA: Diagnosis not present

## 2019-03-26 DIAGNOSIS — J029 Acute pharyngitis, unspecified: Secondary | ICD-10-CM | POA: Diagnosis present

## 2019-03-26 DIAGNOSIS — F17210 Nicotine dependence, cigarettes, uncomplicated: Secondary | ICD-10-CM | POA: Diagnosis not present

## 2019-03-26 DIAGNOSIS — Z79899 Other long term (current) drug therapy: Secondary | ICD-10-CM | POA: Diagnosis not present

## 2019-03-26 NOTE — ED Triage Notes (Signed)
Pt co sore throat x 1 day HX strep

## 2019-03-27 LAB — GROUP A STREP BY PCR: Group A Strep by PCR: NOT DETECTED

## 2019-03-27 MED ORDER — AMOXICILLIN-POT CLAVULANATE 875-125 MG PO TABS: 1.0000 | ORAL_TABLET | Freq: Two times a day (BID) | ORAL | 0 refills | Status: AC

## 2019-03-27 NOTE — Discharge Instructions (Signed)
You were seen today for sore throat.  Your strep screen is negative.  You will be treated for tonsillitis.  Take antibiotics as prescribed.  Follow-up with ENT given your recurrent strep infections and pharyngitis.  You may need your tonsils removed.

## 2019-03-27 NOTE — ED Provider Notes (Signed)
[facility] EMERGENCY DEPARTMENT Provider Note   CSN: [id number] Arrival date & time: 03/26/19  2340     History Chief Complaint  Patient presents with  . Sore Throat    Mary Mills is a 33 y.o. female.  HPI     This is a 33 year old female with a history of recurrent strep who presents with sore throat.  Patient reports 1 day history of sore throat.  She has noted white spots on her tonsils.  She denies any difficulty swallowing.  She has not had any fevers, cough, congestion, shortness of breath, chest pain, abdominal pain.  She rates her pain currently at 5 out of 10.  She thinks she has strep throat again.  Past Medical History:  Diagnosis Date  . Plantar fasciitis, bilateral   . Strep throat     There are no problems to display for this patient.   Past Surgical History:  Procedure Laterality Date  . CESAREAN SECTION    . TUBAL LIGATION       OB History   No obstetric history on file.     History reviewed. No pertinent family history.  Social History   Tobacco Use  . Smoking status: Current Some Day Smoker    Packs/day: 0.50    Types: Cigarettes  . Smokeless tobacco: Never Used  Substance Use Topics  . Alcohol use: No  . Drug use: No    Home Medications Prior to Admission medications   Medication Sig Start Date End Date Taking? Authorizing Provider  amoxicillin (AMOXIL) 500 MG capsule Take 1 capsule (500 mg total) by mouth 2 (two) times daily. 09/21/18   Tilden Fossa, MD  amoxicillin-clavulanate (AUGMENTIN) 875-125 MG tablet Take 1 tablet by mouth every 12 (twelve) hours. 03/27/19   Brynnlee Cumpian, Mayer Masker, MD  lidocaine (XYLOCAINE) 2 % solution Use as directed 15 mLs in the mouth or throat every 6 (six) hours as needed for mouth pain. 11/21/15   Pisciotta, Joni Reining, PA-C  naproxen (NAPROSYN) 500 MG tablet Take 500 mg by mouth 2 (two) times daily with a meal.    [provider]  ondansetron (ZOFRAN ODT) 4 MG disintegrating tablet  Take 1 tablet (4 mg total) by mouth every 8 (eight) hours as needed for nausea or vomiting. 05/12/18   Renne Crigler, PA-C  predniSONE (DELTASONE) 50 MG tablet Take 1 tablet daily with breakfast 11/21/15   Pisciotta, Joni Reining, PA-C    Allergies    Patient has no known allergies.  Review of Systems   Review of Systems  Constitutional: Negative for fever.  HENT: Positive for sore throat. Negative for trouble swallowing.   Respiratory: Negative for shortness of breath.   Cardiovascular: Negative for chest pain.  Gastrointestinal: Negative for abdominal pain, diarrhea, nausea and vomiting.  Genitourinary: Negative for dysuria.  Skin: Negative for rash.  All other systems reviewed and are negative.   Physical Exam Updated Vital Signs BP 124/87   Pulse 94   Temp 98.1 F (36.7 C) (Oral)   Resp 16   Ht 1.651 m (5\' 5" )   Wt 113.4 kg   LMP 03/20/2019   SpO2 98%   BMI 41.60 kg/m   Physical Exam Vitals and nursing note reviewed.  Constitutional:      Appearance: She is well-developed. She is obese. She is not ill-appearing.  HENT:     Head: Normocephalic and atraumatic.     Nose: No congestion.     Mouth/Throat:     Comments: Oropharynx  moist, there is tonsillar exudate noted bilaterally, left tonsil is 3+, right tonsil is 1+, uvula is midline, no trismus Eyes:     Pupils: Pupils are equal, round, and reactive to light.  Cardiovascular:     Rate and Rhythm: Normal rate and regular rhythm.     Heart sounds: Normal heart sounds.  Pulmonary:     Effort: Pulmonary effort is normal. No respiratory distress.     Breath sounds: No wheezing.  Abdominal:     Palpations: Abdomen is soft.  Musculoskeletal:     Cervical back: Neck supple.  Lymphadenopathy:     Cervical: Cervical adenopathy present.  Skin:    General: Skin is warm and dry.  Neurological:     Mental Status: She is alert and oriented to person, place, and time.  Psychiatric:        Mood and Affect: Mood normal.      ED Results / Procedures / Treatments   Labs (all labs ordered are listed, but only abnormal results are displayed) Labs Reviewed  GROUP A STREP BY PCR    EKG None  Radiology No results found.  Procedures Procedures (including critical care time)  Medications Ordered in ED Medications - No data to display  ED Course  I have reviewed the triage vital signs and the nursing notes.  Pertinent labs & imaging results that were available during my care of the patient were reviewed by me and considered in my medical decision making (see chart for details).    MDM Rules/Calculators/A&P                       Patient presents with sore throat.  History of recurrent strep.  She is overall nontoxic and vital signs are reassuring.  She is afebrile.  She does have tonsillar exudate and cervical adenopathy.  She has absence of other symptoms.  This would put her 3 out of 4 [facility] criteria.  She is strep negative.  However, given her physical exam, suspect tonsillitis.  We will treat with Augmentin which would also treat for false negative strep.  Patient was given ENT follow-up given recurrent episodes of strep and sore throat.  After history, exam, and medical workup I feel the patient has been appropriately medically screened and is safe for discharge home. Pertinent diagnoses were discussed with the patient. Patient was given return precautions.   Final Clinical Impression(s) / ED Diagnoses Final diagnoses:  Tonsillitis    Rx / DC Orders ED Discharge Orders         Ordered    amoxicillin-clavulanate (AUGMENTIN) 875-125 MG tablet  Every 12 hours     03/27/19 0043           Jaylen Claude, Barbette Hair, MD 03/27/19 0110

## 2019-09-30 ENCOUNTER — Emergency Department (HOSPITAL_BASED_OUTPATIENT_CLINIC_OR_DEPARTMENT_OTHER)
Admission: EM | Admit: 2019-09-30 | Discharge: 2019-09-30 | Disposition: A | Discharge status: Home / Self Care | Payer: BC Managed Care – PPO | Attending: Emergency Medicine | Admitting: Emergency Medicine

## 2019-09-30 ENCOUNTER — Other Ambulatory Visit: Payer: Self-pay

## 2019-09-30 ENCOUNTER — Encounter (HOSPITAL_BASED_OUTPATIENT_CLINIC_OR_DEPARTMENT_OTHER): Payer: Self-pay | Admitting: Emergency Medicine

## 2019-09-30 DIAGNOSIS — N920 Excessive and frequent menstruation with regular cycle: Secondary | ICD-10-CM | POA: Insufficient documentation

## 2019-09-30 DIAGNOSIS — Z5321 Procedure and treatment not carried out due to patient leaving prior to being seen by health care provider: Secondary | ICD-10-CM | POA: Insufficient documentation

## 2019-09-30 DIAGNOSIS — R109 Unspecified abdominal pain: Secondary | ICD-10-CM | POA: Insufficient documentation

## 2019-09-30 LAB — URINALYSIS, ROUTINE W REFLEX MICROSCOPIC
Bilirubin Urine: NEGATIVE
Glucose, UA: NEGATIVE mg/dL
Ketones, ur: NEGATIVE mg/dL
Leukocytes,Ua: NEGATIVE
Nitrite: NEGATIVE
Protein, ur: NEGATIVE mg/dL
Specific Gravity, Urine: >1.03 — ABNORMAL HIGH (ref 1.005–1.030)
pH: 6 (ref 5.0–8.0)

## 2019-09-30 LAB — URINALYSIS, MICROSCOPIC (REFLEX)

## 2019-09-30 LAB — PREGNANCY, URINE: Preg Test, Ur: NEGATIVE

## 2019-09-30 NOTE — ED Triage Notes (Signed)
Severe low abd cramping. States she has had a heavy menstrual.

## 2019-09-30 NOTE — ED Notes (Signed)
Per registration, pt left department at this time. LWBS after triage.

## 2019-10-13 ENCOUNTER — Emergency Department (HOSPITAL_BASED_OUTPATIENT_CLINIC_OR_DEPARTMENT_OTHER)
Admission: EM | Admit: 2019-10-13 | Discharge: 2019-10-13 | Disposition: A | Discharge status: Home / Self Care | Payer: BC Managed Care – PPO | Attending: Emergency Medicine | Admitting: Emergency Medicine

## 2019-10-13 ENCOUNTER — Emergency Department (HOSPITAL_BASED_OUTPATIENT_CLINIC_OR_DEPARTMENT_OTHER): Payer: BC Managed Care – PPO

## 2019-10-13 ENCOUNTER — Other Ambulatory Visit: Payer: Self-pay

## 2019-10-13 ENCOUNTER — Encounter (HOSPITAL_BASED_OUTPATIENT_CLINIC_OR_DEPARTMENT_OTHER): Payer: Self-pay | Admitting: Emergency Medicine

## 2019-10-13 DIAGNOSIS — E669 Obesity, unspecified: Secondary | ICD-10-CM | POA: Diagnosis not present

## 2019-10-13 DIAGNOSIS — U071 COVID-19: Secondary | ICD-10-CM | POA: Insufficient documentation

## 2019-10-13 DIAGNOSIS — R059 Cough, unspecified: Secondary | ICD-10-CM

## 2019-10-13 DIAGNOSIS — R05 Cough: Secondary | ICD-10-CM | POA: Diagnosis present

## 2019-10-13 DIAGNOSIS — F17210 Nicotine dependence, cigarettes, uncomplicated: Secondary | ICD-10-CM | POA: Diagnosis not present

## 2019-10-13 DIAGNOSIS — B349 Viral infection, unspecified: Secondary | ICD-10-CM

## 2019-10-13 DIAGNOSIS — R509 Fever, unspecified: Secondary | ICD-10-CM | POA: Insufficient documentation

## 2019-10-13 LAB — SARS CORONAVIRUS 2 BY RT PCR (HOSPITAL ORDER, PERFORMED IN ~~LOC~~ HOSPITAL LAB): SARS Coronavirus 2: POSITIVE — AB

## 2019-10-13 NOTE — ED Notes (Signed)
Called and informed Mary Mills that COVID was positive

## 2019-10-13 NOTE — ED Provider Notes (Addendum)
[facility] EMERGENCY DEPARTMENT Provider Note   CSN: [id number] Arrival date & time: 10/13/19  1611     History Chief Complaint  Patient presents with  . Cough    Mary Mills is a 34 y.o. female presenting for evaluation of cough and fever.  Patient states she developed a fever yesterday.  T-max 101.  Today she developed a productive cough.  States she is coughing up yellow mucus.  She reports fever has improved today.  She denies ear pain, nasal congestion, sore throat, chest pain, shortness of breath, nausea, vomiting, abd pain, urinary symptoms, abnormal bowel movements.  She denies a history of COPD or asthma.  She does not not smoke cigarettes.  She denies sick contacts.  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  She has not received her Covid vaccines.  Has been taking Mucinex for her symptoms, has not tried anything else.  HPI     Past Medical History:  Diagnosis Date  . Plantar fasciitis, bilateral   . Strep throat     There are no problems to display for this patient.   Past Surgical History:  Procedure Laterality Date  . CESAREAN SECTION    . TUBAL LIGATION       OB History   No obstetric history on file.     No family history on file.  Social History   Tobacco Use  . Smoking status: Current Some Day Smoker    Packs/day: 0.50    Types: Cigarettes  . Smokeless tobacco: Never Used  Vaping Use  . Vaping Use: Never used  Substance Use Topics  . Alcohol use: No  . Drug use: No    Home Medications Prior to Admission medications   Medication Sig Start Date End Date Taking? Authorizing Provider  amoxicillin (AMOXIL) 500 MG capsule Take 1 capsule (500 mg total) by mouth 2 (two) times daily. 09/21/18   Tilden Fossa, MD  amoxicillin-clavulanate (AUGMENTIN) 875-125 MG tablet Take 1 tablet by mouth every 12 (twelve) hours. 03/27/19   Horton, Mayer Masker, MD  lidocaine (XYLOCAINE) 2 % solution Use as  directed 15 mLs in the mouth or throat every 6 (six) hours as needed for mouth pain. 11/21/15   Pisciotta, Joni Reining, PA-C  naproxen (NAPROSYN) 500 MG tablet Take 500 mg by mouth 2 (two) times daily with a meal.    [provider]  ondansetron (ZOFRAN ODT) 4 MG disintegrating tablet Take 1 tablet (4 mg total) by mouth every 8 (eight) hours as needed for nausea or vomiting. 05/12/18   Renne Crigler, PA-C  predniSONE (DELTASONE) 50 MG tablet Take 1 tablet daily with breakfast 11/21/15   Pisciotta, Joni Reining, PA-C    Allergies    Patient has no known allergies.  Review of Systems   Review of Systems  Constitutional: Positive for fever.  Respiratory: Positive for cough.   All other systems reviewed and are negative.   Physical Exam Updated Vital Signs BP 117/84 (BP Location: Right Arm)   Pulse 97   Temp 98.6 F (37 C) (Oral)   Resp 18   Ht 5\' 5"  (1.651 m)   Wt 115.7 kg   LMP 09/28/2019   SpO2 98%   BMI 42.43 kg/m   Physical Exam Vitals and nursing note reviewed.  Constitutional:      General: She is not in acute distress.    Appearance: She is well-developed. She is obese.     Comments: Obese female  resting comfortably in the bed in no acute distress  HENT:     Head: Normocephalic and atraumatic.     Comments: OP clear without tonsillar swelling or exudate.  Uvula midline with equal palate rise.  TMs nonerythematous nonbulging bilaterally.  Mild nasal mucosal edema without significant rhinorrhea Eyes:     Conjunctiva/sclera: Conjunctivae normal.     Pupils: Pupils are equal, round, and reactive to light.  Cardiovascular:     Rate and Rhythm: Normal rate and regular rhythm.     Pulses: Normal pulses.     Comments: Initially tachycardic, this improved without intervention.  No tachycardia on my exam Pulmonary:     Effort: Pulmonary effort is normal. No respiratory distress.     Breath sounds: Normal breath sounds. No wheezing.     Comments: Speaking in full sentences.   Clear lung sounds in all fields. Abdominal:     General: There is no distension.     Palpations: Abdomen is soft. There is no mass.     Tenderness: There is no abdominal tenderness. There is no guarding or rebound.  Musculoskeletal:        General: Normal range of motion.     Cervical back: Normal range of motion and neck supple.  Skin:    General: Skin is warm and dry.  Neurological:     Mental Status: She is alert and oriented to person, place, and time.     ED Results / Procedures / Treatments   Labs (all labs ordered are listed, but only abnormal results are displayed) Labs Reviewed  SARS CORONAVIRUS 2 BY RT PCR (HOSPITAL ORDER, PERFORMED IN [facility] LAB)    EKG None  Radiology DG Chest Portable 1 View  Result Date: 10/13/2019 CLINICAL DATA:  Cough.  Fever. EXAM: PORTABLE CHEST 1 VIEW COMPARISON:  February 20, 2018 FINDINGS: The heart size and mediastinal contours are within normal limits. Both lungs are clear. The visualized skeletal structures are unremarkable. IMPRESSION: No active disease. Electronically Signed   By: Gerome Sam III M.D   On: 10/13/2019 17:31    Procedures Procedures (including critical care time)  Medications Ordered in ED Medications - No data to display  ED Course  I have reviewed the triage vital signs and the nursing notes.  Pertinent labs & imaging results that were available during my care of the patient were reviewed by me and considered in my medical decision making (see chart for details).    MDM Rules/Calculators/A&P                          Patient presenting with 1 day h/o cough and fever.  Physical exam reassuring, patient is afebrile and appears nontoxic.  Pulmonary exam reassuring.  X-ray viewed interpreted by me, no pneumonia with regular effusion, cardiomegaly.  Exam not consistent with strep or other bacterial infection.  Likely viral URI.  Covid test pending.  Discussed pending results, and importance of  quarantine if results are positive.  Will have pt continue to treat symptomatically.  Patient to follow-up with primary care as needed.  At this time, patient appears safe for discharge.  Return precautions given.  Patient states she understands and agrees to plan.  HELGA ASBURY was evaluated in Emergency Department on 10/13/2019 for the symptoms described in the history of present illness. She was evaluated in the context of the global COVID-19 pandemic, which necessitated consideration that the patient might be at risk  for infection with the SARS-CoV-2 virus that causes COVID-19. Institutional protocols and algorithms that pertain to the evaluation of patients at risk for COVID-19 are in a state of rapid change based on information released by regulatory bodies including the CDC and federal and state organizations. These policies and algorithms were followed during the patient's care in the ED.  1806: covid test positive. RN called pt and reviewed quarantine instructions.   Final Clinical Impression(s) / ED Diagnoses Final diagnoses:  Viral illness  Cough  Fever, unspecified fever cause    Rx / DC Orders ED Discharge Orders    None       Alveria Apley, PA-C 10/13/19 1750    Alveria Apley, PA-C 10/13/19 1807    Long, Arlyss Repress, MD 10/20/19 [phone]

## 2019-10-13 NOTE — ED Triage Notes (Signed)
Cough and fever since yesterday.

## 2019-10-13 NOTE — Discharge Instructions (Signed)
You likely have a viral illness.  This should be treated symptomatically. Use Tylenol or ibuprofen as needed for fevers or body aches. Continue using the Mucinex to help with cough. Make sure you stay well-hydrated with water. Wash your hands frequently to prevent spread of infection. If your Covid test is positive, you will receive a phone call.  If it is negative, you will not.  Either way, you may check online on MyChart. If your test is positive, you will need to stay home for a total of 10 days from symptom onset.  You may end quarantine if you are fever free for 24 hours. If your test is positive, you will need to contact people you have been around in the past 3 days and instruct them to isolate at home and monitor for symptoms.  If they have had the Covid vaccine, they do not need to isolate. Follow-up with your primary care doctor in 1 week if your symptoms are not improving. Return to the emergency room if you develop chest pain, difficulty breathing, or any new or worsening symptoms.

## 2019-10-14 ENCOUNTER — Telehealth: Payer: Self-pay | Admitting: Adult Health

## 2019-10-14 NOTE — Telephone Encounter (Signed)
Called and reviewed COVID positivity with patient.  Her symptom onset was on 10/12/2019. Based on her BMI of 42 she meets at risk criteria for monoclonal antibody infusion for mild to moderate COVID 19 to reduce the risk of hospitalization and or complications from the virus.  I reviewed this with her.  She is going to research this.  Call back number [phone] given.  Lillard Anes, NP

## 2019-11-06 NOTE — Progress Notes (Signed)
Formatting of this note is different from the original.  CC: Abnormal Uterine Bleeding    Subjective:     HPI: Barbara Erickson is a 34 y.o. y/o [id number] who presents today for abnormal uterine bleeding.  Pt with irregular cycles lasting 7-12 days that come every 3-6 weeks.  Failed treatment with Junel 1/20  She denies any bladder complaints. She does not have any urgency, frequency, hematuria, and stress urinary incontinence. She denies any bowel complaints. She denies any vaginal discharge.     GYNECOLOGIC HISTORY:  She reports no hx of STI and no hx of abnormal Pap    Current Outpatient Medications:   ?  norethindrone-ethinyl estradiol (JUNEL 1/20) 1-20 mg-mcg per tablet, Take 1 tablet by mouth daily., Disp: 30 tablet, Rfl: 11    No Known Allergies    Past Medical History:   Diagnosis Date   ? 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 Plantar fasciitis    ? Umbilical hernia      Past Surgical History:   Procedure Laterality Date   ? CESAREAN SECTION       OB History   Gravida Para Term Preterm AB Living   6 2 2  0 4 2   SAB TAB Ectopic Molar Multiple Live Births   3 1 0 0 0 2     # Outcome Date GA Lbr Len/2nd Weight Sex Delivery Anes PTL Lv   6 TAB            5 SAB            4 SAB            3 SAB            2 Term            1 Term              Social History     Socioeconomic History   ? Marital status: Single     Spouse name: Not on file   ? Number of children: Not on file   ? Years of education: Not on file   ? Highest education level: Not on file   Occupational History   ? Not on file   Tobacco Use   ? Smoking status: Former Smoker     Packs/day: 0.25     Types: Cigarettes   ? Smokeless tobacco: Never Used   Vaping Use   ? Vaping Use: Never used   Substance and Sexual Activity   ? Alcohol use: Yes   ? Drug use: No   ? Sexual activity: Yes     Partners: Male     Birth control/protection: Surgical     Comment: tubal ligation   Other Topics Concern   ? Not on file   Social History Narrative   ? Not on file      Social Determinants of Health     Financial Resource Strain:    ? Difficulty of Paying Living Expenses:    Food Insecurity:    ? Worried About in the Last Year:    ? Programme researcher, broadcasting/film/video in the Last Year:    Transportation Needs:    ? Barista (Medical):    ? Lack of Transportation (Non-Medical):    Physical Activity:    ? Days of Exercise per Week:    ? Minutes of Exercise per Session:    Stress:    ?  Feeling of Stress :    Social Connections:    ? Frequency of Communication with Friends and Family:    ? Frequency of Social Gatherings with Friends and Family:    ? Attends Religious Services:    ? Active Member of Clubs or Organizations:    ? Attends Banker Meetings:    ? Marital Status:      Family History   Problem Relation Age of Onset   ? Clotting disorder Neg Hx    ? Diabetes Neg Hx    ? Psoriasis Neg Hx    ? Cancer Neg Hx      Review Of Systems   REVIEW OF SYSTEMS:  General: no fever, fatigue, weight or appetite changes  Skin: no rashes, lesions, itching, mole change  HEENT: no frequent headaches, hearing changes, sore throat, dental problems, sinus problems    Lymph: no tender or swollen lymph nodes   Breasts: no lumps, nipple discharge, breast pain  Resp: no SOB, cough, wheezing  Cardio: no chest pain, palpitations, edema  GI: no N/V/D, constipation, anorexia, bloating, reflux, blood in stool, leakage of stool   GU: no frequency, dysuria, flank pain, hematuria, incontinence, retention        Musk: no weakness, arthralgia, joint swelling, ROM limitations  Endocrine: no polydypsia, polyphagia, polyuria, heat/cold intolerance, hot flashes, dry skin  Psych: no depression, anxiety, panic attacks    Objective:     PHYSICAL EXAM:  BP 120/78 (Site: Left arm, Position: Sitting)   Ht 5\' 5"  (1.651 m)   Wt 259 lb (117.5 kg)   LMP 10/27/2019   BMI 43.10 kg/m   Constitutional: Well-developed, well-nourished female in no acute distress  Neurological: Alert and oriented to  person, place, and time, affect appropriate  Psychiatric: Mood and affect appropriate  Neck: Supple without masses. Trachea is midline.Thyroid is normal size without masses, carotid pulses normal and no bruits  Respiratory: Clear to auscultation bilaterally. Good air movement with normal work of   breathing.  Cardiovascular: Regular rate and rhythm. Extremities grossly normal, nontender with no edema; pulses regular  Gastrointestinal: Soft, nontender, nondistended. No masses or hernias appreciated. No hepatosplenomegaly. No fluid wave. No rebound or guarding.  Genitourinary:          External Genitalia: Normal female genitalia w/o masses, lesions, rashes     Urethral Meatus: Normal caliber and position     Urethra: Midline, no masses     Bladder: Well-suspended, NT     Vagina: mucosa pink and moist without lesions or ulcers      Cervix: No lesions, normal size and consistency; no cervical motion tenderness      Uterus: Normal size and contour; smooth, mobile, NT  Adnexa/Parametria: No masses; no parametrial nodularity; no tenderness    ________________________________    Assessment/Plan:     Barbara Erickson is a 34 y.o. y/o 29 with Dysfunctional Uterine Bleeding    Plan:  1.  Pill the Low-Ogestrel 1 p.o. daily    Electronically signed by: [id number], MD  11/06/19 1514    Electronically signed by 01/06/20, MD at 11/06/2019  3:14 PM EDT

## 2019-12-20 IMAGING — DX DG CHEST 2V
2 series · 2 of 2 positions shown · non-contrast
Comparison: None

CLINICAL DATA: RIGHT side chest pain since [REDACTED], smoker

EXAM:
CHEST - 2 VIEW

[chest pa]
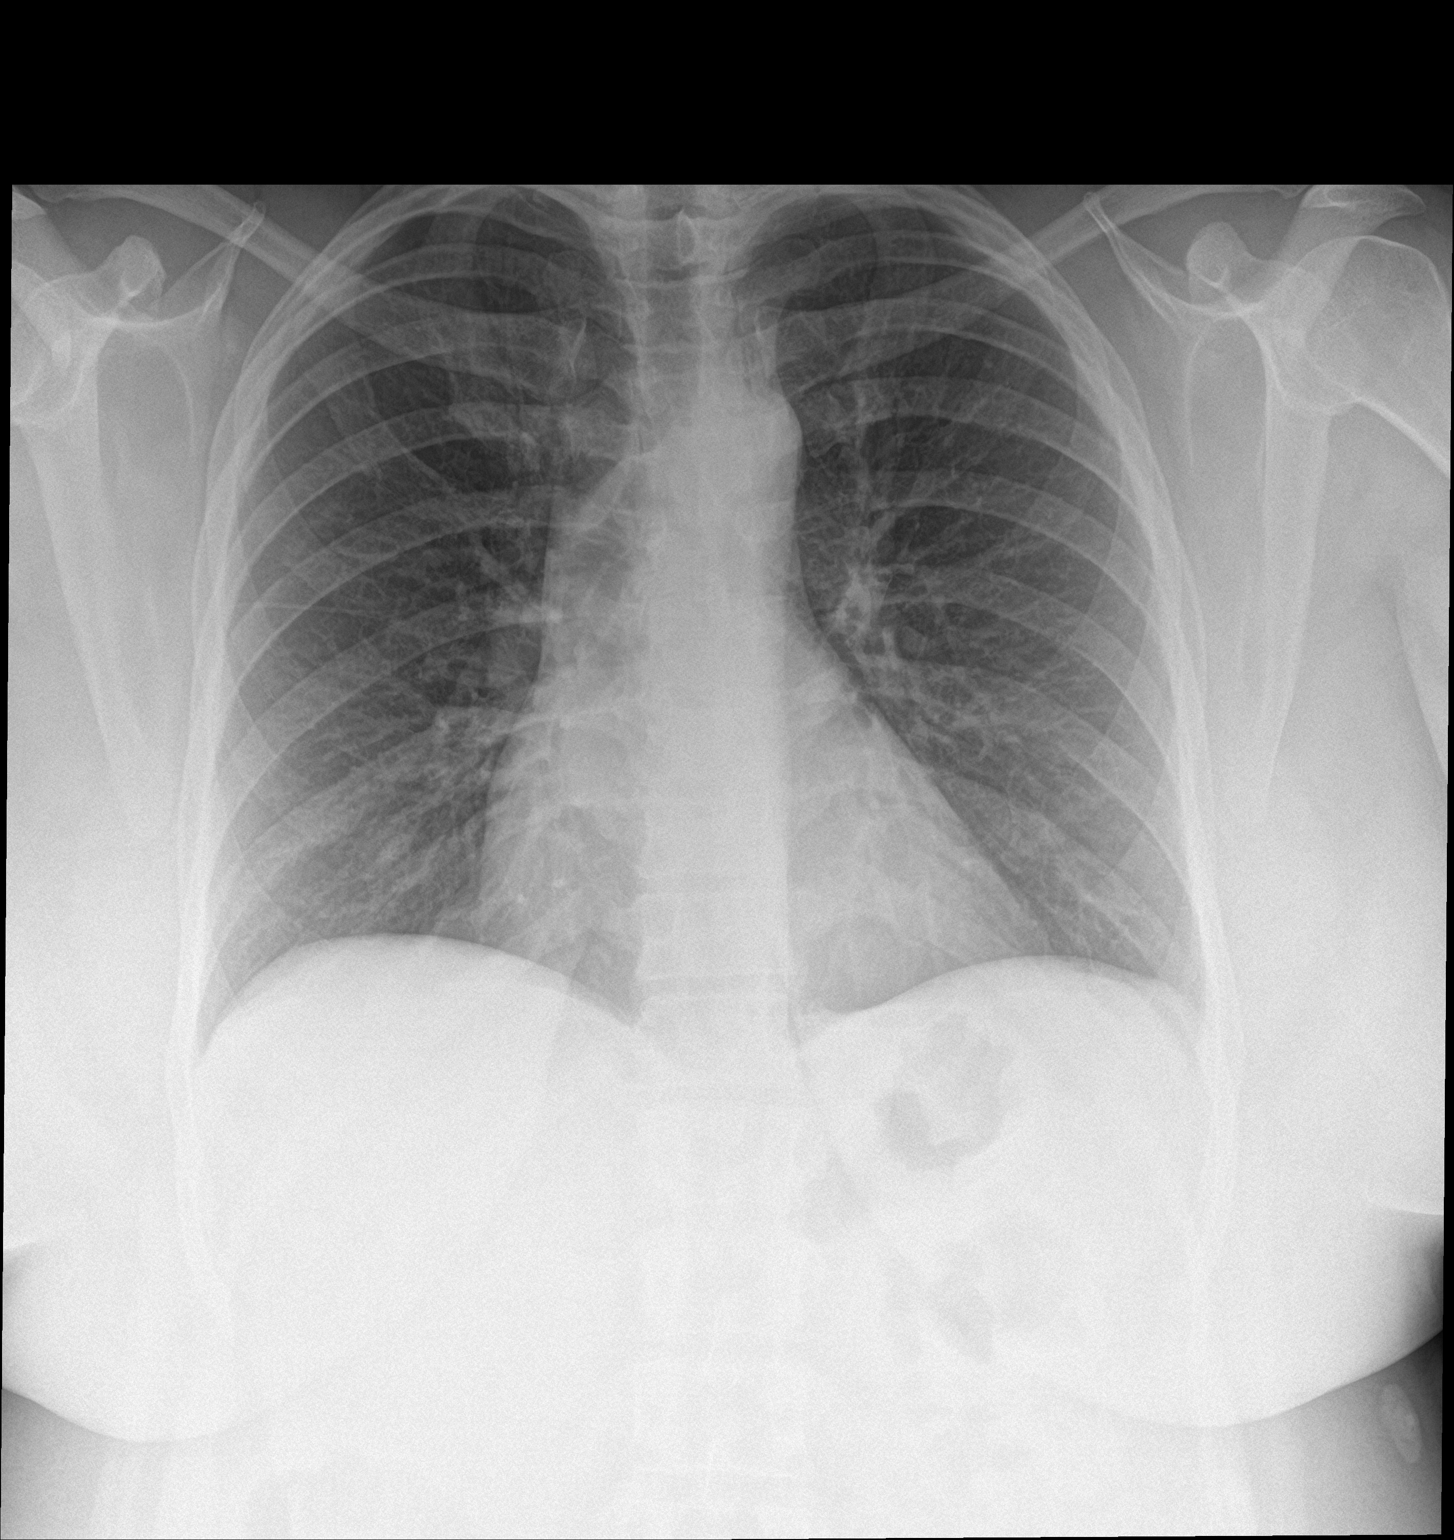

[chest lat]
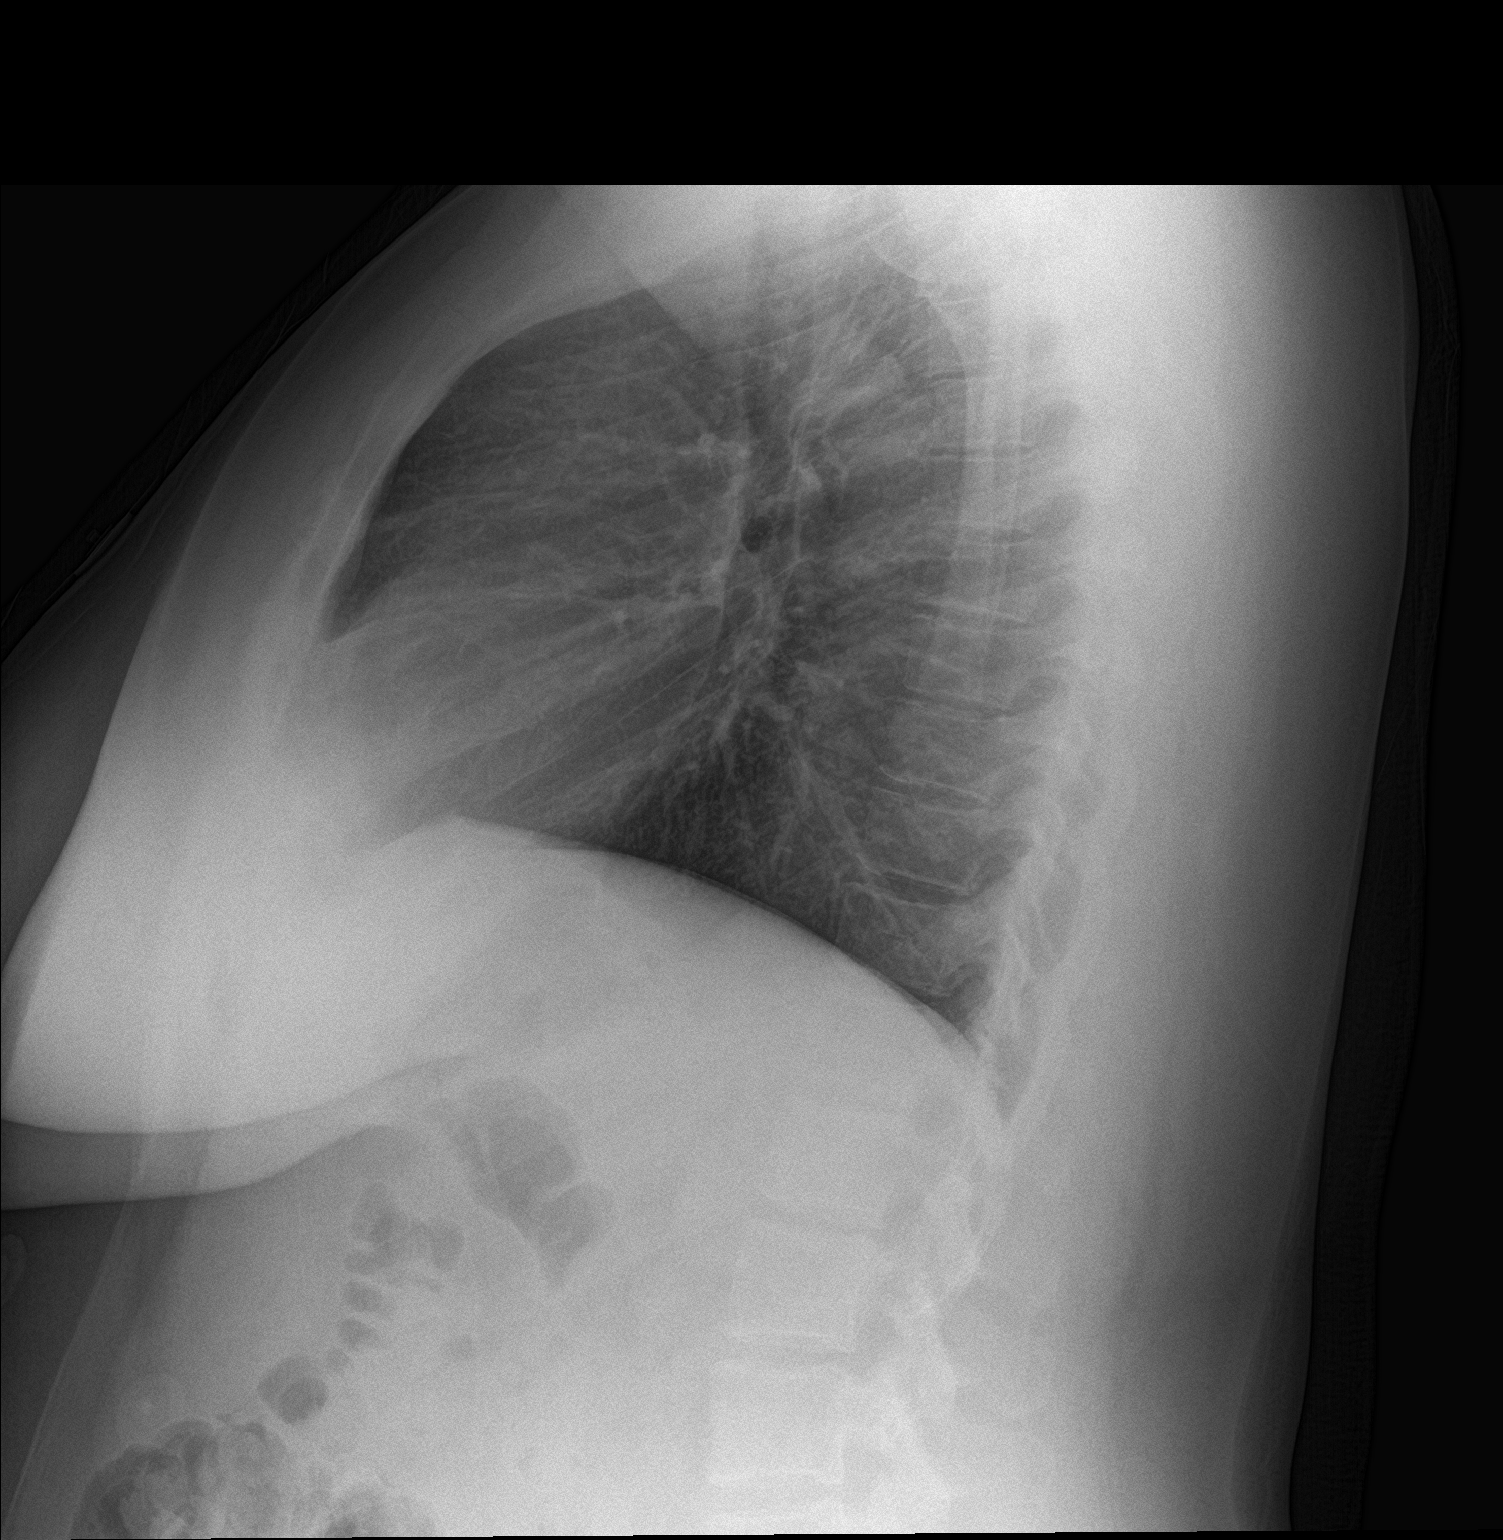

[2 of 2 positions shown; findings below may reference images not displayed]

FINDINGS: Upper normal heart size.

Mediastinal contours and pulmonary vascularity normal.

Lungs clear.

No infiltrate, pleural effusion or pneumothorax.

Bones unremarkable.
IMPRESSION: No acute abnormalities.

## 2020-04-22 ENCOUNTER — Emergency Department (HOSPITAL_BASED_OUTPATIENT_CLINIC_OR_DEPARTMENT_OTHER)
Admission: EM | Admit: 2020-04-22 | Discharge: 2020-04-22 | Disposition: A | Discharge status: Home / Self Care | Payer: BC Managed Care – PPO | Attending: Emergency Medicine | Admitting: Emergency Medicine

## 2020-04-22 ENCOUNTER — Encounter (HOSPITAL_BASED_OUTPATIENT_CLINIC_OR_DEPARTMENT_OTHER): Payer: Self-pay | Admitting: *Deleted

## 2020-04-22 ENCOUNTER — Other Ambulatory Visit: Payer: Self-pay

## 2020-04-22 DIAGNOSIS — J029 Acute pharyngitis, unspecified: Secondary | ICD-10-CM | POA: Diagnosis present

## 2020-04-22 DIAGNOSIS — F17210 Nicotine dependence, cigarettes, uncomplicated: Secondary | ICD-10-CM | POA: Diagnosis not present

## 2020-04-22 DIAGNOSIS — J069 Acute upper respiratory infection, unspecified: Secondary | ICD-10-CM | POA: Diagnosis not present

## 2020-04-22 DIAGNOSIS — U071 COVID-19: Secondary | ICD-10-CM | POA: Insufficient documentation

## 2020-04-22 DIAGNOSIS — Z20822 Contact with and (suspected) exposure to covid-19: Secondary | ICD-10-CM

## 2020-04-22 LAB — GROUP A STREP BY PCR: Group A Strep by PCR: NOT DETECTED

## 2020-04-22 MED ORDER — PROMETHAZINE-DM 6.25-15 MG/5ML PO SYRP: 5.0000 mL | ORAL_SOLUTION | Freq: Four times a day (QID) | ORAL | 0 refills | Status: AC | PRN

## 2020-04-22 MED ORDER — IBUPROFEN 800 MG PO TABS
800.0000 mg | ORAL_TABLET | Freq: Once | ORAL | Status: AC
  Administered 2020-04-22: 800 mg via ORAL
  Filled 2020-04-22: qty 1

## 2020-04-22 MED ORDER — ONDANSETRON 4 MG PO TBDP: 4.0000 mg | ORAL_TABLET | Freq: Three times a day (TID) | ORAL | 0 refills | Status: AC | PRN

## 2020-04-22 MED ORDER — BENZONATATE 100 MG PO CAPS: 100.0000 mg | ORAL_CAPSULE | Freq: Three times a day (TID) | ORAL | 0 refills | Status: AC

## 2020-04-22 NOTE — ED Triage Notes (Signed)
C/o sore throat and pro cough x 3 days

## 2020-04-22 NOTE — ED Provider Notes (Signed)
[facility] EMERGENCY DEPARTMENT Provider Note   CSN: [id number] Arrival date & time: 04/22/20  1324     History Chief Complaint  Patient presents with  . URI    Mary Mills is a 35 y.o. female.  HPI Patient is a 35 year old female with past medical history of strep throat and Planter fasciitis presenting today with sore throat for 3 days as well as productive cough sinus congestion and fatigue.  She states that she has had strep throat more than 10 times over the past decade.   She states that sore throat is achy and constant worse with swallowing.  She states that she has also had some sinus congestion and runny nose.  She states she has fevers and chills but has not checked her temperature at home.  She has not taken any medications prior to arrival today.  No other associated symptoms.  No aggravating mitigating factors.  She denies any chest pain or shortness of breath.     Past Medical History:  Diagnosis Date  . Plantar fasciitis, bilateral   . Strep throat     There are no problems to display for this patient.   Past Surgical History:  Procedure Laterality Date  . CESAREAN SECTION    . TUBAL LIGATION       OB History   No obstetric history on file.     No family history on file.  Social History   Tobacco Use  . Smoking status: Current Some Day Smoker    Packs/day: 0.50    Types: Cigarettes  . Smokeless tobacco: Never Used  Vaping Use  . Vaping Use: Never used  Substance Use Topics  . Alcohol use: No  . Drug use: No    Home Medications Prior to Admission medications   Medication Sig Start Date End Date Taking? Authorizing Provider  benzonatate (TESSALON) 100 MG capsule Take 1 capsule (100 mg total) by mouth every 8 (eight) hours. 04/22/20  Yes Maheen Cwikla S, PA  ondansetron (ZOFRAN ODT) 4 MG disintegrating tablet Take 1 tablet (4 mg total) by mouth every 8 (eight) hours as needed for nausea or vomiting. 04/22/20  Yes Korby Ratay,  Raheem Kolbe S, PA  promethazine-dextromethorphan (PROMETHAZINE-DM) 6.25-15 MG/5ML syrup Take 5 mLs by mouth 4 (four) times daily as needed for cough. 04/22/20  Yes Manha Amato S, PA  amoxicillin (AMOXIL) 500 MG capsule Take 1 capsule (500 mg total) by mouth 2 (two) times daily. 09/21/18   Tilden Fossa, MD  amoxicillin-clavulanate (AUGMENTIN) 875-125 MG tablet Take 1 tablet by mouth every 12 (twelve) hours. 03/27/19   Horton, Mayer Masker, MD  lidocaine (XYLOCAINE) 2 % solution Use as directed 15 mLs in the mouth or throat every 6 (six) hours as needed for mouth pain. 11/21/15   Pisciotta, Joni Reining, PA-C  naproxen (NAPROSYN) 500 MG tablet Take 500 mg by mouth 2 (two) times daily with a meal.    [provider]  predniSONE (DELTASONE) 50 MG tablet Take 1 tablet daily with breakfast 11/21/15   Pisciotta, Joni Reining, PA-C    Allergies    Patient has no known allergies.  Review of Systems   Review of Systems  Constitutional: Positive for chills and fever. Negative for fatigue.  HENT: Positive for congestion, postnasal drip, rhinorrhea and sore throat.   Eyes: Negative for redness.  Respiratory: Positive for cough and shortness of breath.   Cardiovascular: Negative for chest pain and leg swelling.  Gastrointestinal: Negative for abdominal pain, diarrhea, nausea and vomiting.  Endocrine: Negative for polyphagia.  Genitourinary: Negative for dysuria.  Musculoskeletal: Positive for myalgias.  Skin: Negative for rash.  Neurological: Negative for syncope and headaches.  Psychiatric/Behavioral: Negative for confusion.    Physical Exam Updated Vital Signs BP (!) 136/98 (BP Location: Right Arm)   Pulse (!) 102   Temp 98.2 F (36.8 C)   Resp 19   Ht 5\' 5"  (1.651 m)   Wt 117.9 kg   LMP 03/30/2020   SpO2 100%   BMI 43.27 kg/m   Physical Exam Vitals and nursing note reviewed.  Constitutional:      General: She is not in acute distress.    Comments: Pleasant well-appearing 35 year old.  In  no acute distress.  Sitting comfortably in bed.  Able answer questions appropriately follow commands. No increased work of breathing. Speaking in full sentences.  HENT:     Head: Normocephalic and atraumatic.     Nose: Nose normal.     Mouth/Throat:     Mouth: Mucous membranes are moist.  Eyes:     General: No scleral icterus. Cardiovascular:     Rate and Rhythm: Normal rate and regular rhythm.     Pulses: Normal pulses.     Heart sounds: Normal heart sounds.  Pulmonary:     Effort: Pulmonary effort is normal. No respiratory distress.     Breath sounds: No wheezing.     Comments: No increased work of breathing.  Speaking full sentences. Lungs clear to auscultation all fields Abdominal:     Palpations: Abdomen is soft.     Tenderness: There is no abdominal tenderness. There is no guarding or rebound.  Musculoskeletal:     Cervical back: Normal range of motion.     Right lower leg: No edema.     Left lower leg: No edema.     Comments: No lower extremity edema.  No calf tenderness.  Skin:    General: Skin is warm and dry.     Capillary Refill: Capillary refill takes less than 2 seconds.  Neurological:     Mental Status: She is alert. Mental status is at baseline.  Psychiatric:        Mood and Affect: Mood normal.        Behavior: Behavior normal.     ED Results / Procedures / Treatments   Labs (all labs ordered are listed, but only abnormal results are displayed) Labs Reviewed  GROUP A STREP BY PCR  SARS CORONAVIRUS 2 (TAT 6-24 HRS)    EKG None  Radiology No results found.  Procedures Procedures (including critical care time)  Medications Ordered in ED Medications  ibuprofen (ADVIL) tablet 800 mg (has no administration in time range)    ED Course  I have reviewed the triage vital signs and the nursing notes.  Pertinent labs & imaging results that were available during my care of the patient were reviewed by me and considered in my medical decision making (see  chart for details).    MDM Rules/Calculators/A&P                          Patient is a 35 year old female presented today with symptoms detailed in HPI primarily concerned about sore throat.  She also does have fevers chills cough congestion.  High suspicion for COVID-19 or other viral illness.  Her physical exam is unremarkable she is well-appearing.  Will provide her with ibuprofen recommend fluids and Tylenol and ibuprofen at home.  Work  note provided to patient as well as cough medicine and Zofran should she feel nauseous.  YOSHIKA VENSEL was evaluated in Emergency Department on 04/22/2020 for the symptoms described in the history of present illness. She was evaluated in the context of the global COVID-19 pandemic, which necessitated consideration that the patient might be at risk for infection with the SARS-CoV-2 virus that causes COVID-19. Institutional protocols and algorithms that pertain to the evaluation of patients at risk for COVID-19 are in a state of rapid change based on information released by regulatory bodies including the CDC and federal and state organizations. These policies and algorithms were followed during the patient's care in the ED.  Final Clinical Impression(s) / ED Diagnoses Final diagnoses:  Upper respiratory tract infection, unspecified type  Suspected COVID-19 virus infection    Rx / DC Orders ED Discharge Orders         Ordered    benzonatate (TESSALON) 100 MG capsule  Every 8 hours        04/22/20 1815    promethazine-dextromethorphan (PROMETHAZINE-DM) 6.25-15 MG/5ML syrup  4 times daily PRN        04/22/20 1815    ondansetron (ZOFRAN ODT) 4 MG disintegrating tablet  Every 8 hours PRN        04/22/20 1815           Gailen Shelter, [place] 04/22/20 1854    Vanetta Mulders, MD 04/24/20 1901

## 2020-04-22 NOTE — Discharge Instructions (Addendum)
Your COVID test is pending; you should expect results within 24 hours. You can access your results on your MyChart--if you test positive you should receive a phone call.  In the meantime follow CDC guidelines and quarantine, wear a mask, wash hands often.   Please use Tylenol or ibuprofen for pain.  You may use 600 mg ibuprofen every 6 hours or 1000 mg of Tylenol every 6 hours.  You may choose to alternate between the 2.  This would be most effective.  Not to exceed 4 g of Tylenol within 24 hours.  Not to exceed 3200 mg ibuprofen 24 hours.   Please take over the counter vitamin D 2000-4000 units per day. I also recommend zinc 50 mg per day for the next two weeks.   Please return to ED if you feel have difficulty breathing or have emergent, new or concerning symptoms.  Patients who have symptoms consistent with COVID-19 should self isolated for: At least 3 days (72 hours) have passed since recovery, defined as resolution of fever without the use of fever reducing medications and improvement in respiratory symptoms (e.g., cough, shortness of breath), and At least 7 days have passed since symptoms first appeared.       Person Under Monitoring Name: Mary Mills  Location: [address] [place] [address]   Infection Prevention Recommendations for Individuals Confirmed to have, or Being Evaluated for, 2019 Novel Coronavirus (COVID-19) Infection Who Receive Care at Home  Individuals who are confirmed to have, or are being evaluated for, COVID-19 should follow the prevention steps below until a healthcare provider or local or state health department says they can return to normal activities.  Stay home except to get medical care You should restrict activities outside your home, except for getting medical care. Do not go to work, school, or public areas, and do not use public transportation or taxis.  Call ahead before visiting your doctor Before your medical appointment,  call the healthcare provider and tell them that you have, or are being evaluated for, COVID-19 infection. This will help the healthcare providers office take steps to keep other people from getting infected. Ask your healthcare provider to call the local or state health department.  Monitor your symptoms Seek prompt medical attention if your illness is worsening (e.g., difficulty breathing). Before going to your medical appointment, call the healthcare provider and tell them that you have, or are being evaluated for, COVID-19 infection. Ask your healthcare provider to call the local or state health department.  Wear a facemask You should wear a facemask that covers your nose and mouth when you are in the same room with other people and when you visit a healthcare provider. People who live with or visit you should also wear a facemask while they are in the same room with you.  Separate yourself from other people in your home As much as possible, you should stay in a different room from other people in your home. Also, you should use a separate bathroom, if available.  Avoid sharing household items You should not share dishes, drinking glasses, cups, eating utensils, towels, bedding, or other items with other people in your home. After using these items, you should wash them thoroughly with soap and water.  Cover your coughs and sneezes Cover your mouth and nose with a tissue when you cough or sneeze, or you can cough or sneeze into your sleeve. Throw used tissues in a lined trash can, and immediately wash  your hands with soap and water for at least 20 seconds or use an alcohol-based hand rub.  Wash your [employer] your hands often and thoroughly with soap and water for at least 20 seconds. You can use an alcohol-based hand sanitizer if soap and water are not available and if your hands are not visibly dirty. Avoid touching your eyes, nose, and mouth with unwashed hands.   Prevention  Steps for Caregivers and Household Members of Individuals Confirmed to have, or Being Evaluated for, COVID-19 Infection Being Cared for in the Home  If you live with, or provide care at home for, a person confirmed to have, or being evaluated for, COVID-19 infection please follow these guidelines to prevent infection:  Follow healthcare providers instructions Make sure that you understand and can help the patient follow any healthcare provider instructions for all care.  Provide for the patients basic needs You should help the patient with basic needs in the home and provide support for getting groceries, prescriptions, and other personal needs.  Monitor the patients symptoms If they are getting sicker, call his or her medical provider and tell them that the patient has, or is being evaluated for, COVID-19 infection. This will help the healthcare providers office take steps to keep other people from getting infected. Ask the healthcare provider to call the local or state health department.  Limit the number of people who have contact with the patient If possible, have only one caregiver for the patient. Other household members should stay in another home or place of residence. If this is not possible, they should stay in another room, or be separated from the patient as much as possible. Use a separate bathroom, if available. Restrict visitors who do not have an essential need to be in the home.  Keep older adults, very young children, and other sick people away from the patient Keep older adults, very young children, and those who have compromised immune systems or chronic health conditions away from the patient. This includes people with chronic heart, lung, or kidney conditions, diabetes, and cancer.  Ensure good ventilation Make sure that shared spaces in the home have good air flow, such as from an air conditioner or an opened window, weather permitting.  Wash your hands  often Wash your hands often and thoroughly with soap and water for at least 20 seconds. You can use an alcohol based hand sanitizer if soap and water are not available and if your hands are not visibly dirty. Avoid touching your eyes, nose, and mouth with unwashed hands. Use disposable paper towels to dry your hands. If not available, use dedicated cloth towels and replace them when they become wet.  Wear a facemask and gloves Wear a disposable facemask at all times in the room and gloves when you touch or have contact with the patients blood, body fluids, and/or secretions or excretions, such as sweat, saliva, sputum, nasal mucus, vomit, urine, or feces.  Ensure the mask fits over your nose and mouth tightly, and do not touch it during use. Throw out disposable facemasks and gloves after using them. Do not reuse. Wash your hands immediately after removing your facemask and gloves. If your personal clothing becomes contaminated, carefully remove clothing and launder. Wash your hands after handling contaminated clothing. Place all used disposable facemasks, gloves, and other waste in a lined container before disposing them with other household waste. Remove gloves and wash your hands immediately after handling these items.  Do not share dishes,  glasses, or other household items with the patient Avoid sharing household items. You should not share dishes, drinking glasses, cups, eating utensils, towels, bedding, or other items with a patient who is confirmed to have, or being evaluated for, COVID-19 infection. After the person uses these items, you should wash them thoroughly with soap and water.  Wash laundry thoroughly Immediately remove and wash clothes or bedding that have blood, body fluids, and/or secretions or excretions, such as sweat, saliva, sputum, nasal mucus, vomit, urine, or feces, on them. Wear gloves when handling laundry from the patient. Read and follow directions on labels of  laundry or clothing items and detergent. In general, wash and dry with the warmest temperatures recommended on the label.  Clean all areas the individual has used often Clean all touchable surfaces, such as counters, tabletops, doorknobs, bathroom fixtures, toilets, phones, keyboards, tablets, and bedside tables, every day. Also, clean any surfaces that may have blood, body fluids, and/or secretions or excretions on them. Wear gloves when cleaning surfaces the patient has come in contact with. Use a diluted bleach solution (e.g., dilute bleach with 1 part bleach and 10 parts water) or a household disinfectant with a label that says EPA-registered for coronaviruses. To make a bleach solution at home, add 1 tablespoon of bleach to 1 quart (4 cups) of water. For a larger supply, add  cup of bleach to 1 gallon (16 cups) of water. Read labels of cleaning products and follow recommendations provided on product labels. Labels contain instructions for safe and effective use of the cleaning product including precautions you should take when applying the product, such as wearing gloves or eye protection and making sure you have good ventilation during use of the product. Remove gloves and wash hands immediately after cleaning.  Monitor yourself for signs and symptoms of illness Caregivers and household members are considered close contacts, should monitor their health, and will be asked to limit movement outside of the home to the extent possible. Follow the monitoring steps for close contacts listed on the symptom monitoring form.   ? If you have additional questions, contact your local health department or call the epidemiologist on call at [phone] (available 24/7). ? This guidance is subject to change. For the most up-to-date guidance from [facility], please refer to their website: [email or web address]

## 2020-04-23 LAB — SARS CORONAVIRUS 2 (TAT 6-24 HRS): SARS Coronavirus 2: POSITIVE — AB

## 2020-08-16 ENCOUNTER — Other Ambulatory Visit: Payer: Self-pay

## 2020-08-16 ENCOUNTER — Emergency Department (HOSPITAL_BASED_OUTPATIENT_CLINIC_OR_DEPARTMENT_OTHER)
Admission: EM | Admit: 2020-08-16 | Discharge: 2020-08-16 | Disposition: A | Discharge status: Home / Self Care | Payer: BC Managed Care – PPO | Attending: Emergency Medicine | Admitting: Emergency Medicine

## 2020-08-16 ENCOUNTER — Encounter (HOSPITAL_BASED_OUTPATIENT_CLINIC_OR_DEPARTMENT_OTHER): Payer: Self-pay

## 2020-08-16 DIAGNOSIS — R059 Cough, unspecified: Secondary | ICD-10-CM | POA: Diagnosis present

## 2020-08-16 DIAGNOSIS — J029 Acute pharyngitis, unspecified: Secondary | ICD-10-CM | POA: Diagnosis not present

## 2020-08-16 DIAGNOSIS — F17210 Nicotine dependence, cigarettes, uncomplicated: Secondary | ICD-10-CM | POA: Diagnosis not present

## 2020-08-16 LAB — GROUP A STREP BY PCR: Group A Strep by PCR: NOT DETECTED

## 2020-08-16 NOTE — ED Provider Notes (Signed)
[facility] EMERGENCY DEPARTMENT Provider Note  CSN: [id number] Arrival date & time: 08/16/20 0957    History Chief Complaint  Patient presents with  . Sore Throat    HPI  Mary Mills is a 35 y.o. female with history of strep throat, reports onset of sore throat yesterday, began having a cough during the night some rhinorrhea. No fever, CP, SOB.Took OTC meds with minimal improvement.   Past Medical History:  Diagnosis Date  . Plantar fasciitis, bilateral   . Strep throat     Past Surgical History:  Procedure Laterality Date  . CESAREAN SECTION    . HERNIA REPAIR    . TUBAL LIGATION      History reviewed. No pertinent family history.  Social History   Tobacco Use  . Smoking status: Current Some Day Smoker    Packs/day: 0.50    Types: Cigarettes  . Smokeless tobacco: Never Used  Vaping Use  . Vaping Use: Never used  Substance Use Topics  . Alcohol use: No  . Drug use: No     Home Medications Prior to Admission medications   Medication Sig Start Date End Date Taking? Authorizing Provider  amoxicillin (AMOXIL) 500 MG capsule Take 1 capsule (500 mg total) by mouth 2 (two) times daily. 09/21/18   Tilden Fossa, MD  amoxicillin-clavulanate (AUGMENTIN) 875-125 MG tablet Take 1 tablet by mouth every 12 (twelve) hours. 03/27/19   Horton, Mayer Masker, MD  benzonatate (TESSALON) 100 MG capsule Take 1 capsule (100 mg total) by mouth every 8 (eight) hours. 04/22/20   Gailen Shelter, PA  lidocaine (XYLOCAINE) 2 % solution Use as directed 15 mLs in the mouth or throat every 6 (six) hours as needed for mouth pain. 11/21/15   Pisciotta, Joni Reining, PA-C  naproxen (NAPROSYN) 500 MG tablet Take 500 mg by mouth 2 (two) times daily with a meal.    [provider]  ondansetron (ZOFRAN ODT) 4 MG disintegrating tablet Take 1 tablet (4 mg total) by mouth every 8 (eight) hours as needed for nausea or vomiting. 04/22/20   Gailen Shelter, PA  predniSONE (DELTASONE)  50 MG tablet Take 1 tablet daily with breakfast 11/21/15   Pisciotta, Joni Reining, PA-C  promethazine-dextromethorphan (PROMETHAZINE-DM) 6.25-15 MG/5ML syrup Take 5 mLs by mouth 4 (four) times daily as needed for cough. 04/22/20   Gailen Shelter, PA     Allergies    Patient has no known allergies.   Review of Systems   Review of Systems A comprehensive review of systems was completed and negative except as noted in HPI.    Physical Exam BP (!) 130/97   Pulse 88   Temp 98.5 F (36.9 C) (Oral)   Resp 18   Ht 5\' 5"  (1.651 m)   Wt 117.9 kg   LMP 08/02/2020   SpO2 100%   BMI 43.27 kg/m   Physical Exam Vitals and nursing note reviewed.  Constitutional:      Appearance: Normal appearance.  HENT:     Head: Normocephalic and atraumatic.     Nose: Rhinorrhea present.     Mouth/Throat:     Mouth: Mucous membranes are moist.     Pharynx: Posterior oropharyngeal erythema present. No oropharyngeal exudate.     Tonsils: No tonsillar exudate.  Eyes:     Extraocular Movements: Extraocular movements intact.     Conjunctiva/sclera: Conjunctivae normal.  Cardiovascular:     Rate and Rhythm: Normal rate.  Pulmonary:     Effort:  Pulmonary effort is normal.     Breath sounds: Normal breath sounds.  Abdominal:     General: Abdomen is flat.     Palpations: Abdomen is soft.     Tenderness: There is no abdominal tenderness.  Musculoskeletal:        General: No swelling. Normal range of motion.     Cervical back: Neck supple.  Lymphadenopathy:     Cervical: No cervical adenopathy.  Skin:    General: Skin is warm and dry.  Neurological:     General: No focal deficit present.     Mental Status: She is alert.  Psychiatric:        Mood and Affect: Mood normal.      ED Results / Procedures / Treatments   Labs (all labs ordered are listed, but only abnormal results are displayed) Labs Reviewed  GROUP A STREP BY PCR    EKG None   Radiology No results  found.  Procedures Procedures  Medications Ordered in the ED Medications - No data to display   MDM Rules/Calculators/A&P MDM Given history of strep will check a swab. Otherwise this is likely a viral illness and can be treated conservatively.  ED Course  I have reviewed the triage vital signs and the nursing notes.  Pertinent labs & imaging results that were available during my care of the patient were reviewed by me and considered in my medical decision making (see chart for details).     Final Clinical Impression(s) / ED Diagnoses Final diagnoses:  Viral pharyngitis    Rx / DC Orders ED Discharge Orders    None       Pollyann Savoy, MD 08/16/20 1225

## 2020-08-16 NOTE — ED Triage Notes (Signed)
Pt reports sore throat starting Thursday "when the weather changed". States she thought it was strep throat but didn't see the "white stuff back there". Also endorses a cough starting yesterday. Reports taking mucinex with no relief.

## 2021-02-24 ENCOUNTER — Inpatient Hospital Stay
Admit: 2021-02-24 | Discharge: 2021-02-24 | Disposition: A | Discharge status: Home / Self Care | Attending: Emergency Medicine

## 2021-02-24 DIAGNOSIS — L03039 Cellulitis of unspecified toe: Secondary | ICD-10-CM

## 2021-02-24 DIAGNOSIS — L03032 Cellulitis of left toe: Secondary | ICD-10-CM

## 2021-02-24 MED ORDER — DOXYCYCLINE HYCLATE 100 MG PO TABS
100 MG | ORAL_TABLET | Freq: Two times a day (BID) | ORAL | 0 refills | Status: AC
Start: 2021-02-24 — End: 2021-03-03

## 2021-02-24 NOTE — ED Notes (Signed)
Patient changed to gown      Gordy Savers, RN  02/24/21 1058

## 2021-02-24 NOTE — ED Triage Notes (Signed)
Pt states she has had left pinky toe pain for past 2 months, states it seemed to clear up but came back. Denies injury states began having symptoms after getting toes done. Also states she has had rectal pain for past week when having BM, denies blood in stool or known hemorrhoids.

## 2021-02-24 NOTE — ED Notes (Signed)
I have reviewed discharge instructions with the patient.  The patient verbalized understanding.    Patient left ED via Discharge Method: ambulatory to Home with self    Opportunity for questions and clarification provided.       Patient given 1 scripts.         To continue your aftercare when you leave the hospital, you may receive an automated call from our care team to check in on how you are doing.  This is a free service and part of our promise to provide the best care and service to meet your aftercare needs.??? If you have questions, or wish to unsubscribe from this service please call [phone].  Thank you for Choosing our [facility] Emergency Department.      Gordy Savers, RN  02/24/21 1215

## 2021-02-24 NOTE — Discharge Instructions (Addendum)
I recommend he take a daily stool softener such as MiraLAX to help with your constipation.  Please take the antibiotics as prescribed.    We would love to help you get a primary care doctor for follow-up after your emergency department visit.    Please call [phone] between 7AM - 6PM Monday to Friday.  A care navigator will be able to assist you with setting up a doctor close to your home.

## 2021-02-24 NOTE — ED Provider Notes (Signed)
Emergency Department Provider Note                   PCP:                No primary care provider on file.               Age: 35 y.o.      Sex: female       ICD-10-CM    1. Cellulitis of fifth toe  L03.039       2. Constipation, unspecified constipation type  K59.00           DISPOSITION Decision To Discharge 02/24/2021 11:58:44 AM        MDM  Number of Diagnoses or Management Options  Cellulitis of fifth toe  Constipation, unspecified constipation type  Diagnosis management comments: Patient presents for toe cellulitis and constipation.  Vital signs here are reviewed she is in no acute distress.  Patient is not a diabetic.  Patient will be switched to doxycycline for MRSA coverage.  Patient was given podiatrist referral.  Patient was given constipation discharge instructions.  Patient was given return precautions.  Patient stable discharge examination               No orders of the defined types were placed in this encounter.       Medications - No data to display    New Prescriptions    DOXYCYCLINE HYCLATE (VIBRA-TABS) 100 MG TABLET    Take 1 tablet by mouth 2 times daily for 7 days        Barbara Erickson is a 35 y.o. female who presents to the Emergency Department with chief complaint of    Chief Complaint   Patient presents with    Toe Pain    Rectal Pain      35 year old female presents emerged department via private vehicle with chief complaint of toe cellulitis.  Patient reports that this is been a recurrent infection.  She had her nails done.  She states he presented to an urgent care and she had been placed on a cephalosporin and it seemed to clear up her infection.  She reports that it has now returned.  She also reports having a secondary issue with constipation.  She is not on any daily stool softeners.  She denies any alleviating or aggravating factors.  Symptoms been constant in time.  She denies any allergies.  She is a current tobacco smoker she denies alcohol or drug use.  She denies being a  diabetic.          Review of Systems   Constitutional:  Negative for activity change, chills and fever.   HENT:  Negative for dental problem, drooling, facial swelling, sore throat, trouble swallowing and voice change.    Eyes:  Negative for pain.   Respiratory:  Negative for cough, chest tightness and shortness of breath.    Cardiovascular:  Negative for chest pain and palpitations.   Gastrointestinal:  Negative for abdominal pain, nausea and vomiting.   Endocrine: Negative for polydipsia.   Genitourinary:  Negative for difficulty urinating, dysuria and hematuria.   Musculoskeletal:  Negative for back pain and neck pain.   Skin:  Negative for rash and wound.   Neurological:  Negative for dizziness, seizures, facial asymmetry, speech difficulty, numbness and headaches.   Psychiatric/Behavioral:  Negative for agitation and behavioral problems.      History reviewed. No pertinent past medical history.  Past Surgical History:   Procedure Laterality Date    CESAREAN SECTION          History reviewed. No pertinent family history.     Social History     Socioeconomic History    Marital status: Single     Spouse name: None    Number of children: None    Years of education: None    Highest education level: None   Tobacco Use    Smoking status: Every Day     Packs/day: 0.50     Types: Cigarettes    Smokeless tobacco: Never   Substance and Sexual Activity    Alcohol use: Never         Patient has no known allergies.     Previous Medications    No medications on file        Vitals signs and nursing note reviewed.   Patient Vitals for the past 4 hrs:   Temp Pulse Resp BP SpO2   02/24/21 0958 98.4 ??F (36.9 ??C) -- 18 -- --   02/24/21 0957 -- 94 -- (!) 136/91 99 %          Physical Exam  Vitals and nursing note reviewed.   Constitutional:       General: She is not in acute distress.     Appearance: Normal appearance. She is obese. She is not ill-appearing.   HENT:      Head: Normocephalic and atraumatic.      Right Ear:  Tympanic membrane normal.      Left Ear: Tympanic membrane normal.      Mouth/Throat:      Mouth: Mucous membranes are moist.      Pharynx: Oropharynx is clear.   Eyes:      Extraocular Movements: Extraocular movements intact.      Pupils: Pupils are equal, round, and reactive to light.   Cardiovascular:      Rate and Rhythm: Normal rate and regular rhythm.      Heart sounds: No murmur heard.  Pulmonary:      Effort: No respiratory distress.      Breath sounds: No wheezing or rhonchi.   Abdominal:      Palpations: Abdomen is soft. There is no mass.      Tenderness: There is no abdominal tenderness. There is no guarding.   Musculoskeletal:         General: No swelling or tenderness.      Cervical back: Normal range of motion and neck supple. No rigidity or tenderness.   Skin:     General: Skin is warm and dry.      Capillary Refill: Capillary refill takes less than 2 seconds.      Comments: Left fifth toe has overlying cellulitis.  Plantar warts are on the plantar aspect of her base of her toes   Neurological:      General: No focal deficit present.      Mental Status: She is alert and oriented to person, place, and time. Mental status is at baseline.   Psychiatric:         Mood and Affect: Mood normal.         Behavior: Behavior normal.        Procedures    No results found for any visits on 02/24/21.     No orders to display  Voice dictation software was used during the making of this note.  This software is not perfect and grammatical and other typographical errors may be present.  This note has not been completely proofread for errors.     Glendell Docker, DO  02/24/21 1202

## 2021-04-27 ENCOUNTER — Inpatient Hospital Stay
Admit: 2021-04-27 | Discharge: 2021-04-27 | Disposition: A | Discharge status: Home / Self Care | Payer: BLUE CROSS/BLUE SHIELD | Attending: Emergency Medicine

## 2021-04-27 ENCOUNTER — Emergency Department: Admit: 2021-04-27 | Payer: BLUE CROSS/BLUE SHIELD

## 2021-04-27 DIAGNOSIS — L03116 Cellulitis of left lower limb: Secondary | ICD-10-CM

## 2021-04-27 MED ORDER — DOXYCYCLINE HYCLATE 100 MG PO CAPS
100 MG | ORAL_CAPSULE | Freq: Two times a day (BID) | ORAL | 0 refills | Status: AC
Start: 2021-04-27 — End: 2021-05-07

## 2021-04-27 NOTE — ED Notes (Signed)
I have reviewed discharge instructions with the patient.  The patient verbalized understanding.    Patient left ED via Discharge Method: ambulatory to Home with self.    Opportunity for questions and clarification provided.       Patient given 1 scripts.         To continue your aftercare when you leave the hospital, you may receive an automated call from our care team to check in on how you are doing.  This is a free service and part of our promise to provide the best care and service to meet your aftercare needs.??? If you have questions, or wish to unsubscribe from this service please call [phone].  Thank you for Choosing our [facility] Emergency Department.      Alric Quan, RN  04/27/21 1807

## 2021-04-27 NOTE — ED Notes (Signed)
Seen back in november for cellulitis of left foot, d/c with ABX and took full course. Pt reports healing well. Pt reports intensive itching last night and woke up with anterior left foot swelling.      Heriberto Antigua, RN  04/27/21 1630

## 2021-04-27 NOTE — Discharge Instructions (Addendum)
Take medication as prescribed.  Follow-up with a primary care provider and/or podiatrist.  Return to the emergency department for any new, worsening, or concerning symptoms.    We would love to help you get a primary care doctor for follow-up after your emergency department visit.    Please call [phone] between 7AM - 6PM Monday to Friday.  A care navigator will be able to assist you with setting up a doctor close to your home.

## 2021-04-27 NOTE — ED Provider Notes (Signed)
Emergency Department Provider Note                   PCP:                None Provider               Age: 36 y.o.      Sex: female       ICD-10-CM    1. Cellulitis of left foot  L03.116           DISPOSITION Decision To Discharge 04/27/2021 06:03:29 PM        Medical Decision Making  36 year old female who presents emergency department today with complaint of left foot pain and swelling.  Patient appears in no acute distress.  She is not toxic or ill-appearing.  She is afebrile.  No systemic symptoms.  She denies any falls, trauma, or injury but will check x-ray today to rule out fracture, soft tissue gas, osteomyelitis.    X-ray shows soft tissue swelling but is otherwise unremarkable.  Symptoms consistent with cellulitis.  Will treat with doxycycline as patient reports this did help in the past.  Will refer to primary care and podiatry for follow-up.  Red flag symptoms and return precautions discussed.  Patient will return to the emergency department for any new or worsening symptoms.    Problems Addressed:  Cellulitis of left foot: acute illness or injury    Amount and/or Complexity of Data Reviewed  Radiology: ordered and independent interpretation performed.    Risk  Prescription drug management.             Orders Placed This Encounter   Procedures    XR FOOT LEFT (MIN 3 VIEWS)        Medications - No data to display    Discharge Medication List as of 04/27/2021  6:04 PM        START taking these medications    Details   doxycycline hyclate (VIBRAMYCIN) 100 MG capsule Take 1 capsule by mouth 2 times daily for 10 days, Disp-20 capsule, R-0Normal              Barbara Erickson is a 36 y.o. female who presents to the Emergency Department with chief complaint of    Chief Complaint   Patient presents with    Foot Swelling      36 year old female who presents emergency department today with complaint of left foot swelling and pain.  Patient reports that she had an infection in her foot last year and had resolution  after oral antibiotics.  She states that this occurred after getting a pedicure.  She states that she recently soaked her feet in Epsom salt and over the last few days she has progressively had more pain and swelling in her left foot.  She denies any falls, trauma, or injury.  She is not diabetic.  She denies any significant past medical history.  She denies any treatment for symptoms prior to arrival.    The history is provided by the patient.       Review of Systems   Constitutional:  Negative for fever.   Respiratory:  Negative for shortness of breath.    Cardiovascular:  Negative for chest pain.   Musculoskeletal:         Left foot pain and swelling   All other systems reviewed and are negative.    No past medical history on file.     Past Surgical History:  Procedure Laterality Date    CESAREAN SECTION          No family history on file.     Social History     Socioeconomic History    Marital status: Single   Tobacco Use    Smoking status: Every Day     Packs/day: 0.50     Types: Cigarettes    Smokeless tobacco: Never   Substance and Sexual Activity    Alcohol use: Never         Patient has no known allergies.     Discharge Medication List as of 04/27/2021  6:04 PM           Vitals signs and nursing note reviewed.   No data found.         Physical Exam  Vitals and nursing note reviewed.   Constitutional:       General: She is not in acute distress.     Appearance: Normal appearance. She is well-developed. She is not ill-appearing, toxic-appearing or diaphoretic.   HENT:      Head: Normocephalic and atraumatic.      Mouth/Throat:      Mouth: Mucous membranes are moist.   Eyes:      General: No scleral icterus.     Extraocular Movements: Extraocular movements intact.   Cardiovascular:      Rate and Rhythm: Normal rate.      Pulses:           Dorsalis pedis pulses are 2+ on the left side.        Posterior tibial pulses are 2+ on the left side.   Pulmonary:      Effort: Pulmonary effort is normal. No respiratory  distress.   Abdominal:      General: Abdomen is flat. There is no distension.   Musculoskeletal:      Left foot: Normal range of motion and normal capillary refill. Swelling and tenderness present. Normal pulse.      Comments: Swelling and tenderness to dorsal aspect of left foot with slight erythema.   Skin:     General: Skin is warm and dry.      Capillary Refill: Capillary refill takes less than 2 seconds.   Neurological:      General: No focal deficit present.      Mental Status: She is alert and oriented to person, place, and time.   Psychiatric:         Mood and Affect: Mood normal.         Behavior: Behavior normal.        Procedures    Results for orders placed or performed during the hospital encounter of 04/27/21   XR FOOT LEFT (MIN 3 VIEWS)    Narrative    XR FOOT LEFT (MIN 3 VIEWS) 04/27/2021 5:12 PM    HISTORY: Left foot swelling.    COMPARISON: None available.    Three views of the left foot were obtained      Impression    There is no evidence of fracture or other acute bony abnormality in  the foot. No bony lesions are seen.  Mild soft tissue swelling noted about the  dorsum of the foot on the lateral view.        XR FOOT LEFT (MIN 3 VIEWS)   Final Result   There is no evidence of fracture or other acute bony abnormality in   the foot. No bony lesions are seen.  Mild soft tissue  swelling noted about the   dorsum of the foot on the lateral view.                          Voice dictation software was used during the making of this note.  This software is not perfect and grammatical and other typographical errors may be present.  This note has not been completely proofread for errors.       [address], APRN - [place]  04/27/21 [phone]

## 2021-08-11 IMAGING — DX DG CHEST 1V PORT
1 series · 1 of 1 positions shown · non-contrast
Comparison: February 20, 2018

CLINICAL DATA: Cough.  Fever.

EXAM:
PORTABLE CHEST 1 VIEW

[chest ap]
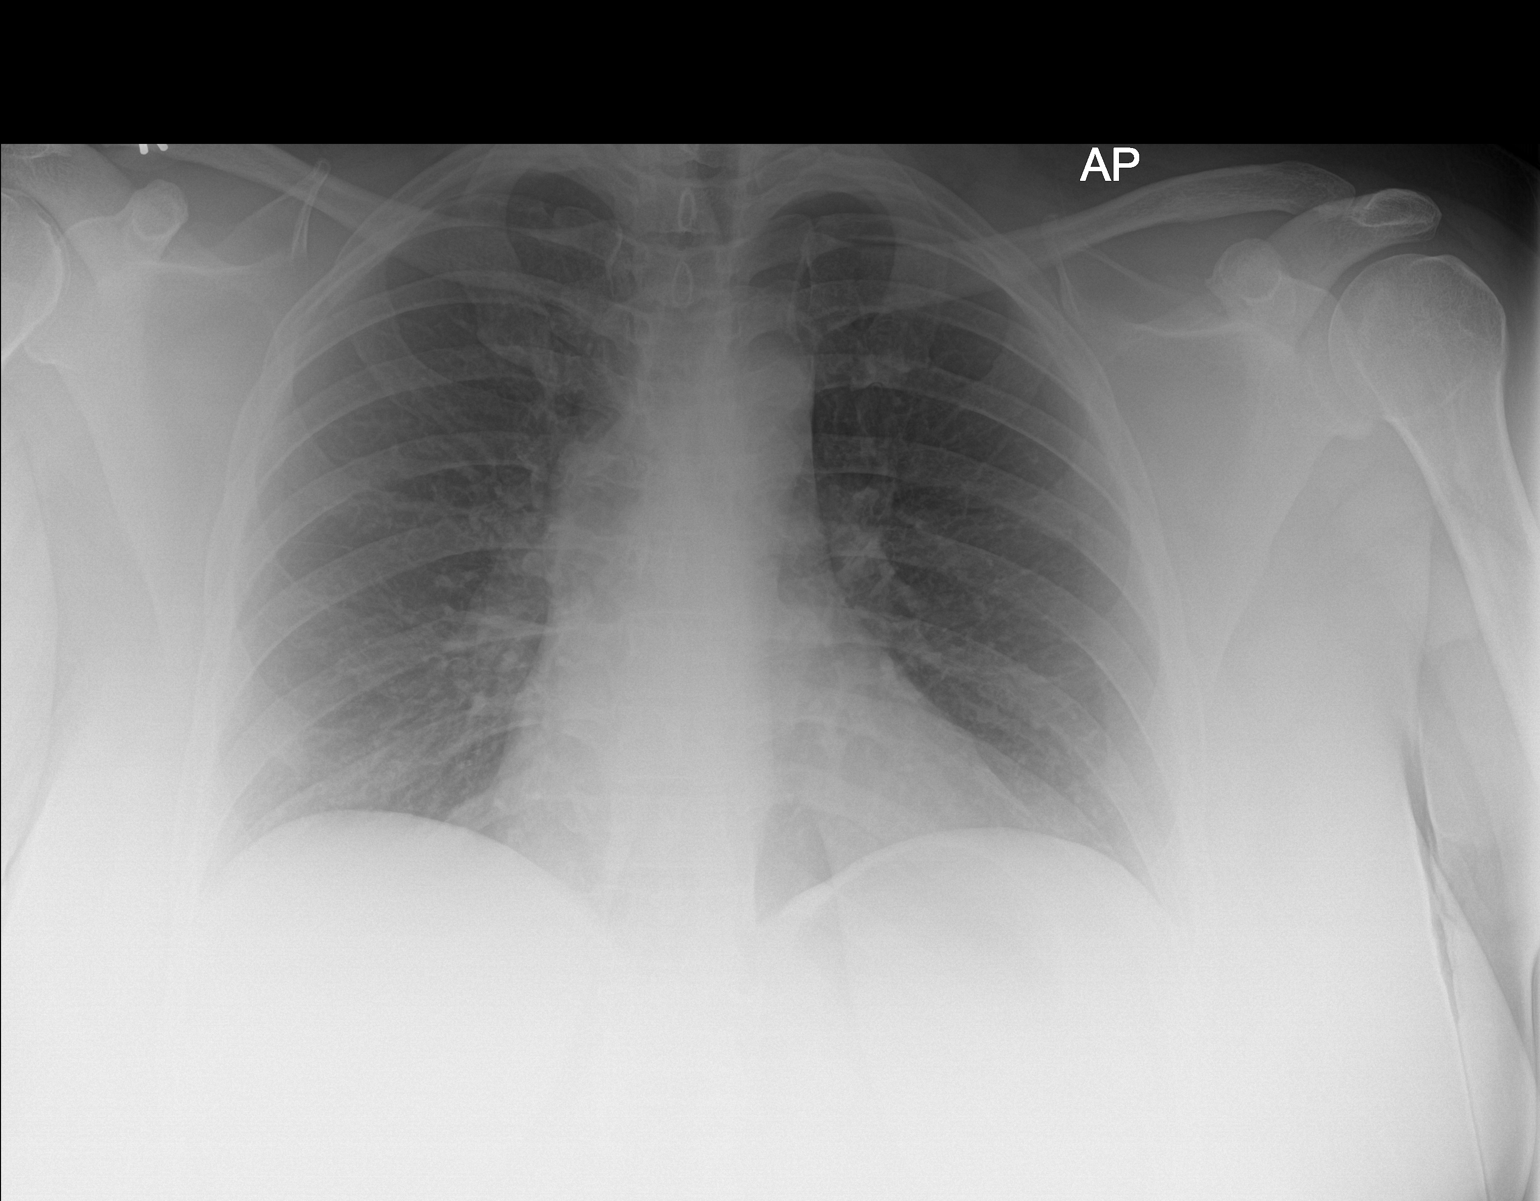

[1 of 1 positions shown; findings below may reference images not displayed]

FINDINGS: The heart size and mediastinal contours are within normal limits.
Both lungs are clear. The visualized skeletal structures are
unremarkable.
IMPRESSION: No active disease.

## 2021-08-30 ENCOUNTER — Inpatient Hospital Stay
Admit: 2021-08-30 | Discharge: 2021-08-31 | Disposition: A | Discharge status: Home / Self Care | Payer: BLUE CROSS/BLUE SHIELD | Attending: Emergency Medicine

## 2021-08-30 DIAGNOSIS — J029 Acute pharyngitis, unspecified: Secondary | ICD-10-CM

## 2021-08-30 NOTE — Discharge Instructions (Addendum)
Your strep test is negative.  We will treat this as a virus.  You may take Tylenol and Motrin as needed for pain.  Return to the ER for any new or worsening symptoms

## 2021-08-30 NOTE — ED Provider Notes (Signed)
[facility]  Free-standing Emergency Department    DISPOSITION Decision To Discharge 08/30/2021 08:18:08 PM       ICD-10-CM    1. Acute pharyngitis, unspecified etiology  J02.9         ED Course     ED Course as of 08/30/21 2035   Wynelle Link Aug 30, 2021   4188 Three 36-year-old female presents with 2 days of pharyngitis.  Vitals within normal limits.  Exam reassuring.  Strep negative.  Will treat as viral illness.  Stable for discharge home.  Return precautions [ER]      ED Course User Index  [ER] Iverson Alamin, MD     Complexity of Problems Addressed:  1 or more stable acute    Data Reviewed and Analyzed:  Category 1:   I ordered each unique test.  I interpreted the results of each unique test.      Category 2:   I interpreted the labs.    Category 3: Discussion of management or test interpretation.  See ED Course above    HPI   Barbara Erickson is a 36 y.o. female with a history of none who presents to the ED with complaint of pharyngitis.  Reports 2-day history of sore throat associate with rhinorrhea and mild, active cough.  No known fevers.  No known sick contacts.  Has had strep in the past and states that this feels similar. Tolerating p.o. at difficulty    History   No past medical history on file.  Past Surgical History:   Procedure Laterality Date    CESAREAN SECTION       No family history on file.  No Known Allergies    Physical Exam     Vitals:    08/30/21 1956   BP: 132/82   Pulse: 80   Resp: 15   Temp: 98.2 F (36.8 C)   TempSrc: Oral   SpO2: 100%   Weight: 260 lb (117.9 kg)   Height: 5\' 4"  (1.626 m)     Nursing note and vitals reviewed.    Constitutional: Well developed, NAD  HEENT: Atraumatic, conjugate gaze, EOM intact; mildly erythematous tonsils bilaterally without exudates.  Midline uvula.  No evidence of PTA  Neck: Supple  Cardiovascular: No cyanosis, diaphoresis, or JVD appreciated.   Respiratory: Effort normal. No respiratory distress.  Gastrointestinal:  Non-distended.   MSK: No deformities appreciated. No peripheral edema.  Skin: Skin is warm and dry. No rash appreciated.  Neuro: Alert and oriented, moves all four extremities.  Psych: Pleasant and cooperative.    Procedures   Procedures    MDM     Labs Reviewed   GROUP A STREP SCREEN BY PCR     Medications - No data to display  No orders to display     Voice dictation software was used during the making of this note.  This software is not perfect and grammatical and other typographical errors may be present.  This note has not been completely proofread for errors.     , MD  08/30/21 2036

## 2021-08-30 NOTE — ED Triage Notes (Signed)
C/o sore throat started about 3 days ago. States she thinks she has strep, pain feels the same as last time she had it. Denies fevers.

## 2021-08-31 LAB — GROUP A STREP SCREEN BY PCR: Strep, Molecular: NOT DETECTED

## 2021-08-31 MED ORDER — GUAIFENESIN ER 600 MG PO TB12
600 MG | ORAL_TABLET | Freq: Two times a day (BID) | ORAL | 0 refills | Status: AC
Start: 2021-08-31 — End: 2021-09-14

## 2021-10-20 ENCOUNTER — Inpatient Hospital Stay
Admit: 2021-10-20 | Discharge: 2021-10-20 | Disposition: A | Discharge status: Home / Self Care | Payer: BLUE CROSS/BLUE SHIELD | Attending: Emergency Medicine

## 2021-10-20 DIAGNOSIS — S93401A Sprain of unspecified ligament of right ankle, initial encounter: Secondary | ICD-10-CM

## 2021-10-20 MED ORDER — KETOROLAC TROMETHAMINE 30 MG/ML IJ SOLN
30 MG/ML | Freq: Once | INTRAMUSCULAR | Status: AC
Start: 2021-10-20 — End: 2021-10-20
  Administered 2021-10-20: 16:00:00 30 mg via INTRAMUSCULAR

## 2021-10-20 MED FILL — KETOROLAC TROMETHAMINE 30 MG/ML IJ SOLN: 30 MG/ML | INTRAMUSCULAR | Qty: 1

## 2021-10-20 NOTE — ED Triage Notes (Signed)
Right ankle pain x 1 day. Chronic in nature, but patient believes she tweaked it going down the stairs or walking yesterday.

## 2021-10-20 NOTE — Discharge Instructions (Signed)
As discussed, continue symptomatic treatment with 800 mg of ibuprofen with 1000 mg of Tylenol every 8 hours as needed for your pain.  Please follow-up with both your PCP and orthopedics if pain persist.

## 2021-10-20 NOTE — ED Notes (Signed)
I have reviewed discharge instructions with the patient.  The patient verbalized understanding.    Patient left ED via Discharge Method: ambulatory to Home with spouse     Opportunity for questions and clarification provided.       Patient given 0 scripts.         To continue your aftercare when you leave the hospital, you may receive an automated call from our care team to check in on how you are doing.  This is a free service and part of our promise to provide the best care and service to meet your aftercare needs." If you have questions, or wish to unsubscribe from this service please call [phone].  Thank you for Choosing our [facility] Emergency Department.       Gordy Savers, RN  10/20/21 1246

## 2021-10-20 NOTE — ED Provider Notes (Signed)
Emergency Department Provider Note       PCP: Pcp No   Age: 36 y.o.   Sex: female     DISPOSITION Decision To Discharge 10/20/2021 12:34:01 PM       ICD-10-CM    1. Sprain of right ankle, unspecified ligament, initial encounter  S93.401A           Medical Decision Making     Complexity of Problems Addressed:  Complexity of Problem: 1 acute complicated illness or injury.    Data Reviewed and Analyzed:  Category 1:   I independently ordered and reviewed each unique test.     Category 3: Discussion of management or test interpretation.  Patient here with acute on chronic right ankle pain.  Took a wrong suggestion has had some posterior aspect pain.  She does have intact Achilles function on that affected side.  I do not see an indication for x-ray today.  No concern for fracture.  I advised her to continue Tylenol and ibuprofen, ice and mild compression with an Ace wrap.  If after 2 weeks of conservative treatment she has continued pain I have advised her to follow-up with orthopedics and I have provided her with information on her discharge paperwork.       Risk of Complications and/or Morbidity of Patient Management:  OTC drug management performed, Prescription drug management performed, and Shared medical decision making was utilized in creating the patients health plan today.    History     Kendalyn Cranfield is a 36 y.o. female who presents to the Emergency Department with chief complaint of    Chief Complaint   Patient presents with    Ankle Pain      36 year old female here with chief complaint of acute on chronic right ankle pain.  Reports yesterday she was walking on some stairs and felt a pop in the posterior aspect of right ankle.  She has been using ibuprofen without significant improvement.  Also been doing some cold soaks.  She continues to ambulate with a nonantalgic gait.  Pain does not radiate from that right ankle.  Worse with any sort of weightbearing.    The history is provided by the patient. No  language interpreter was used.      Physical Exam     Vitals signs and nursing note reviewed.   Vitals:    10/20/21 1159 10/20/21 1200   BP:  133/79   Pulse:  82   Resp:  17   Temp:  98 F (36.7 C)   TempSrc:  Oral   SpO2:  100%   Weight: 270 lb (122.5 kg)    Height: 5\' 5"  (1.651 m)        Physical Exam  Vitals and nursing note reviewed.   Constitutional:       General: She is not in acute distress.     Appearance: Normal appearance. She is not ill-appearing, toxic-appearing or diaphoretic.   HENT:      Head: Normocephalic and atraumatic.      Nose: Nose normal.      Mouth/Throat:      Mouth: Mucous membranes are moist.   Eyes:      Pupils: Pupils are equal, round, and reactive to light.   Cardiovascular:      Rate and Rhythm: Normal rate.   Pulmonary:      Effort: Pulmonary effort is normal. No respiratory distress.   Abdominal:      General: Abdomen is flat.  Palpations: Abdomen is soft.      Tenderness: There is no abdominal tenderness.   Musculoskeletal:         General: Normal range of motion.      Cervical back: Normal range of motion. No rigidity.      Comments: Achilles function intact on right ankle.   Skin:     General: Skin is warm.   Neurological:      General: No focal deficit present.      Mental Status: She is alert.   Psychiatric:         Mood and Affect: Mood normal.        Procedures     Procedures     No orders of the defined types were placed in this encounter.       Medications   ketorolac (TORADOL) injection 30 mg (30 mg IntraMUSCular Given 10/20/21 1218)       New Prescriptions    No medications on file        History reviewed. No pertinent past medical history.     Past Surgical History:   Procedure Laterality Date    CESAREAN SECTION          No results found for any visits on 10/20/21.     No orders to display         Voice dictation software was used during the making of this note.  This software is not perfect and grammatical and other typographical errors may be present.  This note  has not been completely proofread for errors.     Alesia Richards, [place]  10/20/21 [phone]

## 2021-11-27 DIAGNOSIS — L03032 Cellulitis of left toe: Secondary | ICD-10-CM

## 2021-11-27 NOTE — ED Notes (Signed)
I have reviewed discharge instructions with the patient.  The patient verbalized understanding.    Patient left ED via Discharge Method: ambulatory to Home with self.    Opportunity for questions and clarification provided.       Patient given 1 scripts.         To continue your aftercare when you leave the hospital, you may receive an automated call from our care team to check in on how you are doing.  This is a free service and part of our promise to provide the best care and service to meet your aftercare needs." If you have questions, or wish to unsubscribe from this service please call [phone].  Thank you for Choosing our [facility] Emergency Department.       Athena Masse, RN  11/27/21 2201

## 2021-11-27 NOTE — ED Triage Notes (Signed)
Pt ambulatory to triage. Pt states "I think I have gangrene on my toe." Pt with green area between right fourth and fifth toe that she noticed today. Pt states she had gangrene in same area about two months ago.

## 2021-11-27 NOTE — Discharge Instructions (Addendum)
Clean the area twice daily with antibacterial hand soap and water, dry, apply over-the-counter bacitracin ointment and cover with a Band-Aid or bandage.  Antibiotic twice daily for 10 days until complete.  Follow-up with the doctor provided in 7 to 10 days if not improving.  Call the number below for primary care purposes  We would love to help you get a primary care doctor for follow-up after your emergency department visit.    Please call [phone] between 7AM - 6PM Monday to Friday.  A care navigator will be able to assist you with setting up a doctor close to your home.

## 2021-11-27 NOTE — ED Provider Notes (Signed)
Emergency Department Provider Note       PCP: Pcp No   Age: 36 y.o.   Sex: female     DISPOSITION Decision To Discharge 11/27/2021 09:40:04 PM       ICD-10-CM    1. Cellulitis of toe of left foot  L03.032           Medical Decision Making     Complexity of Problems Addressed:  1 or more acute illnesses that pose a threat to life or bodily function.     Data Reviewed and Analyzed:  I independently ordered and reviewed each unique test.             Discussion of management or test interpretation.  The area is very benign looking and it is a little unclear what is the etiology.  There is a little bit of skin breakdown and some discoloration but does not appear gangrenous.  There is no wet skin breakdown or black discoloration.  There is minimal tenderness.  X-ray considered but ultimately not ordered due to the benign nature of it.  Patient has responded to antibiotics in the past and this will be prescribed.  Local wound care recommended.  Primary care referral placed.  On-call family doctor provided for follow-up in about a week if not improving.  Return precautions provided.  I do not believe labs would change the management.  There is nothing to culture.      Risk of Complications and/or Morbidity of Patient Management:  Prescription drug management performed.         History      Barbara Erickson is a 36 y.o. female who presents to the Emergency Department with chief complaint of    Chief Complaint   Patient presents with    Skin Problem      36 year old female presents with a discolored area between her left fourth and fifth toes.  She has had cellulitis in this area in the past.  She [place] from the beach.  She denies purulent drainage, redness warmth or significant pain.  There is some stinging when contact is placed to the area.  No drainage.  No swelling of the foot.  No fevers or chills.         Review of Systems   Constitutional:  Negative for chills and fever.   Skin:  Positive for color change  and wound.   Neurological:  Negative for weakness and numbness.     Physical Exam     Vitals signs and nursing note reviewed:  Vitals:    11/27/21 2054   BP: (!) 143/92   Pulse: 93   Resp: 16   Temp: 98.6 F (37 C)   TempSrc: Oral   SpO2: 99%   Weight: 280 lb (127 kg)   Height: 5\' 5"  (1.651 m)      Physical Exam  Vitals and nursing note reviewed.   Constitutional:       General: She is not in acute distress.     Appearance: She is not ill-appearing or toxic-appearing.   Cardiovascular:      Rate and Rhythm: Normal rate.      Heart sounds: Normal heart sounds.   Pulmonary:      Effort: Pulmonary effort is normal.      Breath sounds: Normal breath sounds.   Musculoskeletal:         General: No swelling or tenderness. Normal range of motion.   Skin:     General:  Skin is warm and dry.      Comments: There are some superficial skin breakdown between the left fourth and fifth toes with some greenish discoloration in this area.  Mild tenderness noted.  No fluctuance or abscess.  No purulent discharge.  No warmth or erythema.  Left lower extremity is neurovascularly intact with 2+ dorsalis pedis pulses and posterior tibial pulses.  Motor and sensation are intact.   Neurological:      Mental Status: She is alert.        Procedures     Procedures    No orders of the defined types were placed in this encounter.       Medications given during this emergency department visit:  Medications - No data to display    New Prescriptions    DOXYCYCLINE HYCLATE (VIBRA-TABS) 100 MG TABLET    Take 1 tablet by mouth 2 times daily for 10 days        History reviewed. No pertinent past medical history.     Past Surgical History:   Procedure Laterality Date    CESAREAN SECTION      HERNIA REPAIR          Social History     Socioeconomic History    Marital status: Single     Spouse name: None    Number of children: None    Years of education: None    Highest education level: None   Tobacco Use    Smoking status: Every Day     Packs/day: 0.50      Types: Cigarettes    Smokeless tobacco: Never   Substance and Sexual Activity    Alcohol use: Never        Previous Medications    No medications on file        No results found for any visits on 11/27/21.     No orders to display                     Voice dictation software was used during the making of this note.  This software is not perfect and grammatical and other typographical errors may be present.  This note has not been completely proofread for errors.     Dione Plover, MD  11/27/21 [phone]

## 2021-11-28 ENCOUNTER — Inpatient Hospital Stay
Admit: 2021-11-28 | Discharge: 2021-11-28 | Disposition: A | Discharge status: Home / Self Care | Payer: BLUE CROSS/BLUE SHIELD | Attending: Emergency Medicine

## 2021-11-28 MED ORDER — DOXYCYCLINE HYCLATE 100 MG PO TABS
100 MG | ORAL_TABLET | Freq: Two times a day (BID) | ORAL | 0 refills | Status: AC
Start: 2021-11-28 — End: 2021-12-07

## 2021-12-17 LAB — HEMOGLOBIN A1C
Estimated Avg Glucose, External: 123 mg/dL
Hemoglobin A1C, External: 5.9 % — ABNORMAL HIGH (ref <5.7)

## 2021-12-17 NOTE — Assessment & Plan Note (Signed)
Associated Problem(s): Chest pain  Formatting of this note might be different from the original.  DDx diagnosis includes symptomatic PACs, cardiomyopathy, electrolyte imbalances, dyspepsia from chronic reflux disease, angina, costochondritis, and panic attack.  EKG is notable for recurrent PACs.  However sinus rhythm.  Normal axis.  Rhythm of 84 bpm.  No ST-T or T wave changes no evidence of Q-waves  Patient's symptoms appears to be most likely related to her premature atrial complexes.  Will obtain echocardiogram and a Holter monitor at this time.   Also check for electrolytes as well as magnesium and will replete if necessary.  We will have patient follow-up with [place] in 4 months.  May warrant cardiology referral depending on findings.    Deferred further cardiac testing for ACS given low likelihood ratio for an acute coronary event.  Patient is female she is less than 50 and based on INTERCHEST Rule she has a score of one-point whichconfers a low risk for coronary artery disease of  2.1%.  We will continue to monitor the patient if symptoms are persistent despite management, will consider a stress test.  Electronically signed by Janae Bridgeman, DO at 12/17/2021 10:56 AM EDT

## 2021-12-17 NOTE — Assessment & Plan Note (Signed)
Associated Problem(s): Tinea pedis  Formatting of this note might be different from the original.  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  Evaluated in the ED at [facility] core and was described doxycycline for cellulitis.  Physical exam highly suggestive of tenia pedis given blanching appearance of skin in the interdigital space between the fourth and fifth toe.  Ordered and a clotrimazole ointment that the patient will need to use twice a day for 4 weeks.  Electronically signed by Janae Bridgeman, DO at 12/17/2021 10:51 AM EDT

## 2021-12-17 NOTE — Patient Instructions (Signed)
Formatting of this note is different from the original.  Images from the original note were not included.      [facility]  Epic User     12/17/2021    Phone: N/A  Email: [email or web address]  Address: [address]  [place], [place] [address]  Hours: Mon-Fri 8 AM - 5 PM     Group fitness classes             [facility] [place] - [facility]: 6.75 miles         [address]  [place], [place] [address]  Language: [place]onds  Fees: Self Pay         Phone: [phone] Email: info@[email or web address] [email or web address] Website: Leta Baptist           [facility]   Distance: 7.08 miles         [address]  [place], [place]  Language: English  Fees: Self Pay         Phone: [phone] Email: info@ymc[place] .org Website: (397) 673-4193            Gym or workout facility             [facility] [place] - [facility]: 6.75 miles         [address]  [place], [place]  Language: [address]  Fees: Self Pay         Phone: [phone] Email: info@ymc[place] .org Website: (097) 353-2992           [facility]   Distance: 7.08 miles         [address]  [place], [place]  Language: English  Fees: Self Pay         Phone: [phone] Email: info@ymc[place] .org Website: (419) 622-2979            Recreation or community center             [facility] [place], Recreation, & Tourism - [place]. [facility]   Distance: 12.28 miles         [address] [place], [place]  Language: [address]  Fees: Free, Self Pay         Phone: [phone] Email: [email or web address] Website:  (941) 740-8144           [facility] [place], Recreation, & Tourism - [facility]: 16.07 miles         [address]  [place], [place]  Language: [place]  Fees: Free, Self Pay         Phone: [phone] Email: [email or web address] Website: [place]onds            Sports clubs and recreational activities             [facility] [place] - [facility]: 6.75 miles         [address]  [place], [address] [place]  Language: [place]  Fees: Self Pay         Phone: [phone] Email: info@ymc[place] .org Website: Lenox Ponds           [facility] of (027) 741-2878 - [facility]   Distance: 7.08 miles         [address]  [place], [place] [address]  Language: English  Fees: Self Pay         Phone: [phone] Email: info@ymc[place] .org Website: https://www.hill.org/            Individual counseling             [employer] [facility] of Mental Health - [facility] - [place] Office   Distance: 3.56 miles         [address]  [place], [place] [address]  Language: [place]  Fees: Insurance, Self Pay, Sliding Fee         Phone: [phone] Website: [email or web address]://[email or web address].[email or web address]           [facility], [facility]   Distance: 4.06 miles         [address] [place], [place] [address]  Language: [place]  Fees: Insurance, Self Pay         Phone: [phone] Email: info@cccbi .net Website: http://dodson-rose.net/            Mental health support group             [employer], [facility] - Group Therapy   Distance: 5.85 miles         [address] [place], [place] [address]  Language: [place]  Fees: Insurance, Self Pay         Phone: [phone] Email:  [email or web address] Website: GyrateAtrophy.si           [facility]   Distance: 12.83 miles         [address]  [place], [place] [address]  Language: [place]  Fees: Insurance, Self Pay         Phone: [phone] Email: info@[facility] .com Website: https://universaltherapeuticservices.com/           Important Numbers and Websites    Emergency Services  911  Suicide Prevention Lifeline  (773)028-2836 Len Childs)  National Runaway Safeline  508 206 8341 (RUNAWAY) City Services  311  Child Abuse Hotline  (903) 431-6884 (4-A-Child)  All-Options Talkline  226-397-6073  Poison Control  (202)434-5453  Sexual Assault Hotline  (410)089-4843 (HOPE)  Substance Abuse Referral  786-845-6285     Your code from today's visit is [id number]  To see this HealtheRx online, please visit [email or web address]     DISCLAIMER: Now[employer] does not endorse any service providers mentioned in this HealtheRx. [employer] does not guarantee that the services mentioned in this HealtheRx will be available to you or will improve your health or wellness.    Electronically signed by Roxine Caddy Rtf Notes at 12/17/2021  9:50 AM EDT

## 2021-12-17 NOTE — Progress Notes (Signed)
Formatting of this note is different from the original.  Images from the original note were not included.     [facility] [address]  [address]  [place], [place] [address]  [phone] (f)   12/17/21     Barbara Erickson  DOB: 09-Feb-1986 MRN: [id number]    Subjective   History of Present Illness   Barbara Erickson is a 36 y.o.female who presents today for a new visit.  Chief Complaint   Patient presents with    New Patient     Moved from [facility]. Check for DMII and concern for cellulitis of LT foot seen at ED given doxycycline hyclate (VIBRA-TABS) 100 MG tablet  .      Patient moved from [facility]. Patient lives in [place] and she moved here for work. She lives with her daughter and her boyfriend. She does not have any pets at home. 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   Diet: Breakfast bar, smoothie. Lunch grabbing something out. Dinner: Financial risk analyst or eating out. She does not eat a lot of fried foods. Meat: salmon, chicken, pork chops. She eats broccoli. Does not eat a lot of sweets  Exercise: None.     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  She states that she works about 7 days a week.  She notes that she is about to step down from this position and only focus on one store.    Patient reports intermittent symptoms of chest pain overnight.  She does not report any symptoms of awakening from chest pain however she states that when she attempts to go to sleep she has to put a pillow between her chest because she experiences a tightness.  She also reports shortness of breath with exertion.  She denies any burning sensation when going to sleep.  She denies any history of heart attack or cardiac disease in her family.  She does have a risk factor for cardiac disease which includes smoking.    PHQ Depression Screening     PHQ2 Score: 0    PHQ2/9 is interpreted as Negative in this patient      Current Outpatient  Medications   Medication Sig Dispense Refill    clotrimazole 1 % Apply topically 2 (two) times a day 56.7 g 0     No current facility-administered medications for this visit.     Immunization History   Administered Date(s) Administered    Tdap 12/17/2021     Health Maintenance/Significant Tests        Review of Systems     Review of Systems   Constitutional:  Negative for chills and fever.   Eyes:  Negative for pain and visual disturbance.   Respiratory:  Negative for shortness of breath and wheezing.    Cardiovascular:  Positive for chest pain (at night). Negative for leg swelling.   Gastrointestinal:  Negative for abdominal pain, diarrhea, nausea and vomiting.   Genitourinary:  Negative for dysuria and hematuria.   Musculoskeletal:  Negative for back pain and myalgias.   Skin:  Negative for rash and wound.   Neurological:  Negative for syncope and headaches.   Psychiatric/Behavioral:  The patient is nervous/anxious.          Objective   Physical Exam   Vital Signs: BP 124/81   Pulse 87   Temp 97 F (36.1 C) (Temporal)   Ht 165.1 cm (65")  Wt 121 kg (266 lb)   SpO2 100%   BMI 44.26 kg/m     Wt Readings from Last 3 Encounters:   12/17/21 121 kg (266 lb)     BP Readings from Last 3 Encounters:   12/17/21 124/81     Physical Exam  Constitutional:       General: She is not in acute distress.     Appearance: She is well-developed.   HENT:      Head: Normocephalic and atraumatic.   Cardiovascular:      Rate and Rhythm: Normal rate and regular rhythm.      Pulses: Normal pulses.           Radial pulses are 2+ on the right side and 2+ on the left side.        Dorsalis pedis pulses are 2+ on the right side and 2+ on the left side.        Posterior tibial pulses are 2+ on the right side and 2+ on the left side.      Heart sounds: Normal heart sounds. No murmur heard.     No friction rub. No gallop.   Pulmonary:      Effort: Pulmonary effort is normal. No respiratory distress.      Breath sounds: Normal breath  sounds. No wheezing or rales.   Chest:      Chest wall: No tenderness.      Comments: No chest wall tenderness noted.  Abdominal:      General: Bowel sounds are normal. There is no distension.      Tenderness: There is no abdominal tenderness. There is no guarding.   Musculoskeletal:         General: Normal range of motion.   Lymphadenopathy:      Cervical: No cervical adenopathy.   Skin:     General: Skin is warm and dry.   Neurological:      Mental Status: She is alert.   Psychiatric:         Behavior: Behavior normal.         Laboratory Studies     No results found for: "WBC", "HGB", "HCT", "MCV", "PLT"    No results found for: "K", "BUN", "CO2", "CREATININE", "GFRNONAA", "ANIONGAP", "CL", "GFRAA", "GLUCOSE", "CALCIUM", "NA"    No results found for: "VTD25"    No results found for: "LDLCALC" No results found for: "CHOL", "LDL", "HDL", "TRIG"    No results found for: "HGBA1C"    No results found for: "HGBA1C"    No results found for: "TSH"    No results found for: "ALBUMIN", "BILITOT", "BILIDIR", "ALKPHOS", "AST", "ALT", "PROT", "BILIINDIR"        EKG is notable for recurrent PACs.  However sinus rhythm.  Normal axis.  Rhythm of 84 bpm.  No ST-T or T wave changes no evidence of Q-waves      Assessment and Plan   Barbara Erickson is a 36 y.o. female who presents to the office today for a new visit.    Routine medical exam  Assessment & Plan:   Routine lab work ordered for this patient at this time.  Patient to follow-up with [place] for any new symptoms or any other concerns.  Otherwise follow-up in 4 months.    Routine lab draw  -     CBC w/Differential; Future  -     Comprehensive Metabolic Panel (CMP); Future  -     Lipid Panel with Reflex to Direct  LDL; Future  -     TSH with Reflex FT4; Future  -     HIV Ag w/HIV 1 & 2 Abs Screen w/reflex; Future  -     Hgb A1C (Glycohemoglobin); Future  -     CBC w/Differential  -     Comprehensive Metabolic Panel (CMP)  -     Lipid Panel with Reflex to Direct LDL  -     TSH with  Reflex FT4  -     HIV Ag w/HIV 1 & 2 Abs Screen w/reflex  -     Hgb A1C (Glycohemoglobin)    Chest pain, unspecified type  Assessment & Plan:  DDx diagnosis includes symptomatic PACs, cardiomyopathy, electrolyte imbalances, dyspepsia from chronic reflux disease, angina, costochondritis, and panic attack.  EKG is notable for recurrent PACs.  However sinus rhythm.  Normal axis.  Rhythm of 84 bpm.  No ST-T or T wave changes no evidence of Q-waves  Patient's symptoms appears to be most likely related to her premature atrial complexes.  Will obtain echocardiogram and a Holter monitor at this time.   Also check for electrolytes as well as magnesium and will replete if necessary.  We will have patient follow-up with [place] in 4 months.  May warrant cardiology referral depending on findings.    Deferred further cardiac testing for ACS given low likelihood ratio for an acute coronary event.  Patient is female she is less than 50 and based on INTERCHEST Rule she has a score of one-point whichconfers a low risk for coronary artery disease of  2.1%.  We will continue to monitor the patient if symptoms are persistent despite management, will consider a stress test.    Orders:  -     ECG 12 Lead; Future  -     ECG 12 Lead  -     Magnesium (Mg); Future  -     Magnesium (Mg)  -     Echocardiogram Complete (w/wo Contrast or Bubble Study); Future  -     Holter; Future    Tinea pedis, unspecified laterality  Assessment & Plan:  Second recurrence of tenia pedis for this patient.  Evaluated in the ED at [facility] and was described doxycycline for cellulitis.  Physical exam highly suggestive of tenia pedis given blanching appearance of skin in the interdigital space between the fourth and fifth toe.  Ordered and a clotrimazole ointment that the patient will need to use twice a day for 4 weeks.    Orders:  -     clotrimazole 1 %; Apply topically 2 (two) times a day    Need for hepatitis C screening test  -     Hepatitis C Ab w/Reflex RNA  PCR; Future  -     Hepatitis C Ab w/Reflex RNA PCR    Need for diphtheria-tetanus-pertussis (Tdap) vaccine  -     Tdap Vaccine Greater Than Or Equal To 7yo IM - clinic administered    Premature atrial complexes  -     Holter; Future        Return in about 4 months (around 04/18/2022) for In person.    Janae Bridgeman, DO 12/17/2021 10:58 AM  [facility] Department of Internal Medicine    BMI counseling: Due to this patient's BMI, I have provided counseling regarding physical activity.    Some aspects of this note were created using voice recognition software. Although efforts are made to correct meaningful transcription errors, grammatical and typographical  errors may still exist. Please contact our office if there are any questions or concerns regarding the contents of this note.        Electronically signed by Janae Bridgeman, DO at 12/17/2021 10:58 AM EDT

## 2021-12-17 NOTE — Patient Instructions (Signed)
Formatting of this note is different from the original.  Images from the original note were not included.      [facility]  Epic User     12/17/2021    Phone: N/A  Email: [email or web address]  Address: [address]  [place], [place] [address]  Hours: Mon-Fri 8 AM - 5 PM     Group fitness classes             [facility] [place] - [facility]: 6.75 miles         [address]  [place], [place] [address]  Language: [place] Ponds  Fees: Self Pay         Phone: [phone] Email: info@[email or web address] Website: [email or web address]           [facility] of [facility]   Distance: 7.08 miles         [address]  [place], [place] [address]  Language: English  Fees: Self Pay         Phone: [phone] Email: info@ymcagreen[place] .org Website: https://www.hill.org/            Gym or workout facility             [facility] [place] [facility]: 6.75 miles         [address]  [place], [place] [address]  Language: [place] Ponds  Fees: Self Pay         Phone: [phone] Email: info@[email or web address] [email or web address] Website: [email or web address]           [facility] [place] [place] [facility]   Distance: 7.08 miles         [address]  [place], [place] [address]  Language: English  Fees: Self Pay         Phone: [phone] Email: info@ymc[place] .org Website: https://www.hill.org/            Recreation or community center             [facility] [place], Recreation, & Tourism - [place]. [facility]   Distance: 12.28 miles         [address] [place], [place] [address]  Language: Lenox Ponds  Fees: Free, Self Pay         Phone: [phone] Email: [email or web address] Website:  [email or web address]           [facility] [place], Recreation, & Tourism - [facility]: 16.07 miles         [address]  [place], [place] [address]  Language: [place] Ponds  Fees: Free, Self Pay         Phone: [phone] Email: [email or web address] Website: PrepaidParty.no            Sports clubs and recreational activities             [facility] of [place] - [facility]: 6.75 miles         [address]  [place], [place] [address]  Language: [place]ox Ponds  Fees: Self Pay         Phone: [phone] Email: info@[email or web address] [email or web address] Website: [email or web address]           [facility] [employer] - [facility]   Distance: 7.08 miles         [address]  [place], [place] [address]  Language: English  Fees: Self Pay         Phone: [phone] Email: info@ymcagreenville .org Website: https://www.hill.org/            Individual counseling             [employer] Department of Mental Health - [facility] - [place] Office   Distance: 3.56 miles         [address]  [place], [place] [address]  Language: [place]  Fees: Insurance, Self Pay, Sliding Fee         Phone: [phone] Website: [email or web address]://[email or web address]           [facility], [facility]   Distance: 4.06 miles         [address] [place], [place] [address]  Language: [place]  Fees: Insurance, Self Pay         Phone: [phone] Email: info@cccbi .net Website: http://dodson-rose.net/            Mental health support group             [employer], [facility] - Group Therapy   Distance: 5.85 miles         [address] [place], [place] [address]  Language: [place]  Fees: Insurance, Self Pay         Phone: [phone] Email:  [email or web address] Website: GyrateAtrophy.si           [facility]   Distance: 12.83 miles         [address]  [place], [place] [address]  Language: [place]  Fees: Insurance, Self Pay         Phone: [phone] Email: info@[facility] .com Website: https://universaltherapeuticservices.com/           Important Numbers and Websites    Emergency Services  911  Suicide Prevention Lifeline  (805)412-3283 Len Childs)  National Runaway Safeline  604-705-0988 (RUNAWAY) City Services  311  Child Abuse Hotline  769-629-1568 (4-A-Child)  All-Options Talkline  (848)711-2187  Poison Control  4127540670  Sexual Assault Hotline  781-328-6626 (HOPE)  Substance Abuse Referral  (541)026-3601     Your code from today's visit is [id number]  To see this HealtheRx online, please visit [email or web address]     DISCLAIMER: [employer] does not endorse any service providers mentioned in this HealtheRx. [employer] does not guarantee that the services mentioned in this HealtheRx will be available to you or will improve your health or wellness.    Electronically signed by Roxine Caddy Rtf Notes at 12/17/2021  9:45 AM EDT

## 2021-12-17 NOTE — Assessment & Plan Note (Signed)
Associated Problem(s): Routine medical exam  Formatting of this note might be different from the original.   Routine lab work ordered for this patient at this time.  Patient to follow-up with [place] for any new symptoms or any other concerns.  Otherwise follow-up in 4 months.    Electronically signed by Janae Bridgeman, DO at 12/17/2021 10:29 AM EDT

## 2022-03-08 ENCOUNTER — Emergency Department: Admit: 2022-03-09 | Payer: BLUE CROSS/BLUE SHIELD

## 2022-03-08 ENCOUNTER — Inpatient Hospital Stay
Admit: 2022-03-08 | Discharge: 2022-03-09 | Disposition: A | Discharge status: Home / Self Care | Payer: BLUE CROSS/BLUE SHIELD

## 2022-03-08 ENCOUNTER — Emergency Department: Admit: 2022-03-08 | Payer: BLUE CROSS/BLUE SHIELD

## 2022-03-08 DIAGNOSIS — K529 Noninfective gastroenteritis and colitis, unspecified: Secondary | ICD-10-CM

## 2022-03-08 LAB — COMPREHENSIVE METABOLIC PANEL
ALT: 13 U/L (ref 13.0–61.0)
AST: 19 U/L (ref 15–37)
Albumin/Globulin Ratio: 1.1 (ref 0.4–1.6)
Albumin: 4.2 g/dL (ref 3.5–5.0)
Alk Phosphatase: 71 U/L (ref 45.0–117.0)
Anion Gap: 11 mmol/L (ref 2–11)
BUN: 10 MG/DL (ref 6–23)
CO2: 25 mmol/L (ref 21–32)
Calcium: 9.3 MG/DL (ref 8.3–10.4)
Chloride: 104 mmol/L (ref 98–107)
Creatinine: 0.63 MG/DL (ref 0.6–1.0)
Est, Glom Filt Rate: >60 mL/min/{1.73_m2} (ref >60)
Globulin: 3.8 g/dL (ref 2.8–4.5)
Glucose: 97 mg/dL (ref 65–100)
Potassium: 3.5 mmol/L (ref 3.5–5.1)
Sodium: 140 mmol/L (ref 133–143)
Total Bilirubin: 0.2 MG/DL (ref 0.2–1.1)
Total Protein: 8 g/dL (ref 6.4–8.2)

## 2022-03-08 LAB — URINALYSIS
Bilirubin Urine: NEGATIVE
Blood, Urine: NEGATIVE
Glucose, UA: NEGATIVE mg/dL
Ketones, Urine: NEGATIVE mg/dL
Leukocyte Esterase, Urine: NEGATIVE
Nitrite, Urine: NEGATIVE
Protein, UA: 30 mg/dL — AB
Specific Gravity, UA: 1.02 (ref 1.001–1.023)
Urobilinogen, Urine: >=8 EU/dL (ref 0.2–1.0)
pH, Urine: 7.5 (ref 5.0–9.0)

## 2022-03-08 LAB — CBC WITH AUTO DIFFERENTIAL
Absolute Immature Granulocyte: 0 10*3/uL (ref 0.0–0.5)
Basophils %: 0 % (ref 0.0–2.0)
Basophils Absolute: 0 10*3/uL (ref 0.0–0.2)
Eosinophils %: 1 % (ref 0.5–7.8)
Eosinophils Absolute: 0.1 10*3/uL (ref 0.0–0.8)
Hematocrit: 31.8 % — ABNORMAL LOW (ref 35.8–46.3)
Hemoglobin: 10.2 g/dL — ABNORMAL LOW (ref 11.7–15.4)
Immature Granulocytes: 0 % (ref 0.0–5.0)
Lymphocytes %: 18 % (ref 13–44)
Lymphocytes Absolute: 2.3 10*3/uL (ref 0.5–4.6)
MCH: 27.1 PG (ref 26.1–32.9)
MCHC: 32.1 g/dL (ref 31.4–35.0)
MCV: 84.4 FL (ref 82.0–102.0)
MPV: 8.8 FL — ABNORMAL LOW (ref 9.4–12.3)
Monocytes %: 6 % (ref 4.0–12.0)
Monocytes Absolute: 0.7 10*3/uL (ref 0.1–1.3)
Neutrophils %: 75 % (ref 43–78)
Neutrophils Absolute: 9.7 10*3/uL — ABNORMAL HIGH (ref 1.7–8.2)
Platelets: 352 10*3/uL (ref 150–450)
RBC: 3.77 M/uL — ABNORMAL LOW (ref 4.05–5.20)
RDW: 14 % (ref 11.9–14.6)
WBC: 12.9 10*3/uL — ABNORMAL HIGH (ref 4.3–11.1)
nRBC: 0 10*3/uL (ref 0.0–0.2)

## 2022-03-08 LAB — URINALYSIS, MICRO
Casts: 0 /lpf
Crystals: 0 /LPF
Mucus, UA: 0 /lpf

## 2022-03-08 LAB — WET PREP, GENITAL
Wet Prep: NONE SEEN
Wet Prep: NONE SEEN

## 2022-03-08 LAB — PROCALCITONIN: Procalcitonin: 0.03 ng/mL (ref 0.00–0.49)

## 2022-03-08 LAB — LACTIC ACID: Lactic Acid: 1.6 mmol/L (ref 0.4–2.0)

## 2022-03-08 LAB — PREGNANCY, URINE: HCG(Urine) Pregnancy Test: NEGATIVE

## 2022-03-08 LAB — LIPASE: Lipase: 13 U/L (ref 13–60)

## 2022-03-08 MED ORDER — CEFTRIAXONE SODIUM 1 G IJ SOLR
1 g | INTRAMUSCULAR | Status: AC
Start: 2022-03-08 — End: 2022-03-08
  Administered 2022-03-08: 1000 mg via INTRAVENOUS

## 2022-03-08 MED ORDER — KETOROLAC TROMETHAMINE 30 MG/ML IJ SOLN
30 MG/ML | Freq: Once | INTRAMUSCULAR | Status: AC
Start: 2022-03-08 — End: 2022-03-08
  Administered 2022-03-08: 30 mg via INTRAVENOUS

## 2022-03-08 MED ORDER — SODIUM CHLORIDE 0.9 % IV BOLUS
0.9 % | Freq: Once | INTRAVENOUS | Status: AC
Start: 2022-03-08 — End: 2022-03-08
  Administered 2022-03-08: 23:00:00 1000 mL via INTRAVENOUS

## 2022-03-08 MED FILL — KETOROLAC TROMETHAMINE 30 MG/ML IJ SOLN: 30 MG/ML | INTRAMUSCULAR | Qty: 1

## 2022-03-08 MED FILL — CEFTRIAXONE SODIUM 1 G IJ SOLR: 1 g | INTRAMUSCULAR | Qty: 1000

## 2022-03-08 NOTE — ED Provider Notes (Incomplete)
Emergency Department Provider Note       PCP: No, Pcp   Age: 36 y.o.   Sex: female     DISPOSITION       No diagnosis found.    Medical Decision Making     Complexity of Problems Addressed:  1 or more acute illnesses that pose a threat to life or bodily function.     Data Reviewed and Analyzed:   I independently ordered and reviewed each unique test.  I reviewed external records: provider visit note from PCP.  I reviewed external records: previous lab results from outside ED.   Reviewed notes from OB/GYN visit 11/06/2019; reviewed notes from general surgery 12/25/2019  {Historian (state who, why needed, what they said):56592}    I independently ordered and interpreted the ED EKG in the absence of a Cardiologist.    Rate: ***  EKG Interpretation: {EKG Interpretation:46700:::1}  ST Segments: {ST Segments:46707}      {test reviewed:53725}    Discussion of management or test interpretation.  Vital signs reviewed, patient stable, NAD, afebrile, nontoxic in appearance   Temperature 98.6.  Heart rate is 100      {Admitted or Consultants involved.:58337}    Risk of Complications and/or Morbidity of Patient Management:  {ZOXW:96045}    ED Course as of 03/08/22 1726   Mon Mar 08, 2022   1712 HCG(Urine) Pregnancy Test: Negative [JG]   1712 Leukocyte Esterase, Urine: Negative [JG]   1712 Nitrite, Urine: Negative [JG]   1712 WBC(!): 12.9 [JG]   1712 Hemoglobin Quant(!): 10.2 [JG]      ED Course User Index  [JG] Shearon Stalls, PA       Is this patient to be included in the SEP-1 core measure due to severe sepsis or septic shock? {SEP1 yes/no:60681}     History      36 year old female with history of cesarean section, abdominal hernia repair, tubal ligation, left oophorectomy presents to the emergency department today with chief complaint of 2 days of severe left-sided lower abdominal pain/pelvic pain.  Patient states pain started in the left flank and was sharp in nature.  Patient states now pain is constant and in the left  side of her pelvis.  Patient denies any vaginal discharge, vaginal pain, dysuria or urinary frequency.  Denies any known fevers or chills denies nausea vomiting or diarrhea.  Patient states laughing makes abdomen hurt, hitting bumps in the car makes abdomen hurt.    The history is provided by the patient. No language interpreter was used.        Review of Systems   Constitutional:  Negative for chills, fatigue and fever.   HENT:  Negative for congestion.    Respiratory:  Negative for cough and shortness of breath.    Cardiovascular:  Negative for chest pain and palpitations.   Gastrointestinal:  Positive for abdominal pain. Negative for diarrhea, nausea and vomiting.   Genitourinary:  Positive for flank pain (Now resolved) and pelvic pain (Left side). Negative for dysuria.   Skin:  Negative for color change.   Neurological:  Negative for weakness and headaches.   All other systems reviewed and are negative.      Physical Exam     Vitals signs and nursing note reviewed:  Vitals:    03/08/22 1634   BP: (!) 156/99   Pulse: 100   Resp: 18   Temp: 98.6 F (37 C)   TempSrc: Oral   SpO2: 100%   Weight: 117.9  kg (260 lb)   Height: 1.651 m (5\' 5" )      Physical Exam  Vitals and nursing note reviewed.   Constitutional:       General: She is not in acute distress.     Appearance: Normal appearance. She is obese. She is not ill-appearing, toxic-appearing or diaphoretic.      Comments: Well-appearing 36 year old female.   HENT:      Head: Atraumatic.      Nose: Nose normal.      Mouth/Throat:      Mouth: Mucous membranes are moist.      Pharynx: Oropharynx is clear.   Eyes:      Extraocular Movements: Extraocular movements intact.      Conjunctiva/sclera: Conjunctivae normal.   Cardiovascular:      Rate and Rhythm: Tachycardia present.      Pulses: Normal pulses.      Heart sounds: Normal heart sounds.   Pulmonary:      Effort: Pulmonary effort is normal.      Breath sounds: Normal breath sounds.   Abdominal:      General:  Bowel sounds are normal.      Palpations: Abdomen is soft.      Tenderness: There is abdominal tenderness (Tenderness in the left side of pelvis and left lower quadrant.  There is some mild tenderness in periumbilical region as well.). There is no right CVA tenderness, left CVA tenderness, guarding or rebound.   Musculoskeletal:         General: Normal range of motion.      Cervical back: Normal range of motion.   Skin:     General: Skin is warm and dry.      Capillary Refill: Capillary refill takes less than 2 seconds.      Coloration: Skin is not jaundiced or pale.   Neurological:      General: No focal deficit present.      Mental Status: She is alert and oriented to person, place, and time.   Psychiatric:         Mood and Affect: Mood normal.         Behavior: Behavior normal.         Thought Content: Thought content normal.         Judgment: Judgment normal.          Procedures     Procedures    Orders Placed This Encounter   Procedures    Wet prep, genital    Culture, Blood 1    Culture, Blood 1    CT ABDOMEN PELVIS W IV CONTRAST Additional Contrast? None    CBC with Diff    CMP    Lipase    Urinalysis w rflx microscopic    Pregnancy, Urine    Urinalysis, Micro    Procalcitonin    Lactic Acid    Diet NPO    Saline lock IV        Medications given during this emergency department visit:  Medications   sodium chloride 0.9 % bolus 1,000 mL (has no administration in time range)   cefTRIAXone (ROCEPHIN) 1,000 mg in sterile water 10 mL IV syringe (has no administration in time range)       New Prescriptions    No medications on file        No past medical history on file.     Past Surgical History:   Procedure Laterality Date    CESAREAN SECTION  HERNIA REPAIR      TUBAL LIGATION          Social History     Socioeconomic History    Marital status: Single   Tobacco Use    Smoking status: Every Day     Packs/day: .25     Types: Cigarettes    Smokeless tobacco: Never   Substance and Sexual Activity    Alcohol use:  Never        Previous Medications    No medications on file        Results for orders placed or performed during the hospital encounter of 03/08/22   Wet prep, genital    Specimen: Vaginal   Result Value Ref Range    Special Requests NO SPECIAL REQUESTS      Wet Prep FEW  WBC'S        Wet Prep NO YEAST SEEN      Wet Prep NO TRICHOMONAS SEEN      Wet Prep FEW  CLUE CELLS PRESENT       CBC with Diff   Result Value Ref Range    WBC 12.9 (H) 4.3 - 11.1 K/uL    RBC 3.77 (L) 4.05 - 5.20 M/uL    Hemoglobin 10.2 (L) 11.7 - 15.4 g/dL    Hematocrit 29.5 (L) 35.8 - 46.3 %    MCV 84.4 82.0 - 102.0 FL    MCH 27.1 26.1 - 32.9 PG    MCHC 32.1 31.4 - 35.0 g/dL    RDW 62.1 30.8 - 65.7 %    Platelets 352 150 - 450 K/uL    MPV 8.8 (L) 9.4 - 12.3 FL    nRBC 0.00 0.0 - 0.2 K/uL    Differential Type AUTOMATED      Neutrophils % 75 43 - 78 %    Lymphocytes % 18 13 - 44 %    Monocytes % 6 4.0 - 12.0 %    Eosinophils % 1 0.5 - 7.8 %    Basophils % 0 0.0 - 2.0 %    Immature Granulocytes 0 0.0 - 5.0 %    Neutrophils Absolute 9.7 (H) 1.7 - 8.2 K/UL    Lymphocytes Absolute 2.3 0.5 - 4.6 K/UL    Monocytes Absolute 0.7 0.1 - 1.3 K/UL    Eosinophils Absolute 0.1 0.0 - 0.8 K/UL    Basophils Absolute 0.0 0.0 - 0.2 K/UL    Absolute Immature Granulocyte 0.0 0.0 - 0.5 K/UL   Urinalysis w rflx microscopic   Result Value Ref Range    Color, UA YELLOW      Appearance CLEAR      Specific Gravity, UA 1.020 1.001 - 1.023      pH, Urine 7.5 5.0 - 9.0      Protein, UA 30 (A) NEG mg/dL    Glucose, UA Negative mg/dL    Ketones, Urine Negative NEG mg/dL    Bilirubin Urine Negative NEG      Blood, Urine Negative NEG      Urobilinogen, Urine >=8.0 0.2 - 1.0 EU/dL    Nitrite, Urine Negative NEG      Leukocyte Esterase, Urine Negative NEG     Pregnancy, Urine   Result Value Ref Range    HCG(Urine) Pregnancy Test Negative     Urinalysis, Micro   Result Value Ref Range    WBC, UA 0-3 0 /hpf    RBC, UA 0-3 0 /hpf    Epithelial Cells UA 0-3 0 /  hpf    BACTERIA, URINE 2+  (H) 0 /hpf    Casts 0 0 /lpf    Crystals 0 0 /LPF    Mucus, UA 0 0 /lpf    Other observations RESULTS VERIFIED MANUALLY          CT ABDOMEN PELVIS W IV CONTRAST Additional Contrast? None    (Results Pending)                     Voice dictation software was used during the making of this note.  This software is not perfect and grammatical and other typographical errors may be present.  This note has not been completely proofread for errors.

## 2022-03-08 NOTE — ED Provider Notes (Incomplete)
Emergency Department Provider Note       PCP: No, Pcp   Age: 36 y.o.   Sex: female     DISPOSITION Decision To Discharge 03/09/2022 12:57:16 AM       ICD-10-CM    1. Enteritis  K52.9 metroNIDAZOLE (FLAGYL) 500 MG tablet     cefdinir (OMNICEF) 300 MG capsule     HYDROcodone-acetaminophen (NORCO) 5-325 MG per tablet     ondansetron (ZOFRAN) 4 MG tablet      2. Bacterial vaginosis  N76.0 metroNIDAZOLE (FLAGYL) 500 MG tablet    B96.89       3. Uterine leiomyoma, unspecified location  D25.9           Medical Decision Making     Complexity of Problems Addressed:  1 or more acute illnesses that pose a threat to life or bodily function.     Data Reviewed and Analyzed:   I independently ordered and reviewed each unique test.  I reviewed external records: provider visit note from PCP.  I reviewed external records: previous lab results from outside ED.   Reviewed notes from OB/GYN visit 11/06/2019; reviewed notes from general surgery 12/25/2019  {Historian (state who, why needed, what they said):56592}          I interpreted the CT Scan CT abdomen pelvis with IV contrast demonstrates uterus lobular in contour with multiple masses measuring up to 5.7 cm extending superiorly off the uterus.  Likely fibroids.  There is slight induration of the left adnexa.  Mild adjacent small bowel wall thickening is noted.  No significant retroperitoneal mesenteric or pelvic adenopathy.  Small bowel wall thickening is near the left adnexa which may be reactive.  It also may be focal enteritis.  I am in agreement with radiologist interpretation..  I interpreted the Ultrasound  pelvic ultrasound today demonstrates nonvisualized ovaries and a fibroid uterus.  No free fluid within the pelvic region.  I am in agreement with radiologist interpretation.  It should be noted that I am not a radiologist therefore I do rely heavily on their interpretation of the scan    Discussion of management or test interpretation.  Vital signs reviewed, patient  stable, NAD, afebrile, nontoxic in appearance   Temperature 98.6.  Heart rate is 100    Patient states significant pain here in the emergency department.  Patient would like conservative pain management.  Will start with Toradol.  hCG is negative.    In summary this is a 36 year old female who presents to the emergency department today with chief complaint of left side abdominal pain/pelvic pain for the past 2 days.  Patient states pain seems to start in her left flank.  Denies any history of kidney stones.  Denies any nausea, vomiting, diarrhea.  Denies any fevers or chills.    Urinalysis is negative for signs of cystitis  Basic lab work was obtained.  Wet prep was also obtained.  Patient states no suspicion for STI  Wet prep positive for clue cells.  Negative for trichomoniasis, negative for yeast.  On CBC patient noted to have a white count of 12.9.  Hemoglobin 10.2.  Added on a lactic acid and a Pro-Cal, blood cultures.  Administered IV ceftriaxone and 1 L normal saline.      Patient's pain well-controlled with Toradol initially.  Patient states pain started to come back.  She wanted some Tylenol.  Did not want any narcotics that she would like to go home.    Lipase is within  normal limits.  No concerning findings on CMP.  See ED course for comments on rest of labs.    CT abdomen pelvis with IV contrast was obtained due to tenderness in left lower quadrant and left side of pelvis.  Differential includes but is not limited to diverticulitis, colitis, enteritis, ureteral lithiasis, obstruction    CT scan today demonstrated irregularity in region of left adnexa.  This could be scarring versus inflammatory process.  There is some mild thickening of the small bowel near area where left adnexa would be located.  Ultrasound imaging recommended    Ultrasound today negative for any evidence of ovaries bilaterally.  They are not well-seen.  This is reassuring.  Had there been an abscess I do believe that this could be  seen on ultrasound today.  Patient has a fibroid uterus.    Based on history, physical exam, work-up today in the emergency department, I do not feel additional lab work or imaging is warranted at this time.  No emergent findings.  Patient is stable and can be discharged home with follow-up to primary care.  Workup today is consistent with likely focal enteritis.  Patient also has some uterine fibroids.  I discussed with patient admission to the hospital for management versus discharge home.  Through shared decision making patient is opted to be discharged home with antibiotics, pain medications.  Discussed work-up, treatment, follow-up and strict return to ED precautions with the patient.  Answered any questions that ***had.  Patient verbalized understanding and agreement.  Reiterated discussion including return to ED precautions and discharge paperwork.       {Admitted or Consultants ZOXWRUEA.:[id number]}    Risk of Complications and/or Morbidity of Patient Management:  Prescription drug management performed.  Patient was discharged risks and benefits of hospitalization were considered.  The following clinical decision tools were used in the care of this patient {Calculators:53729}.  Shared medical decision making was utilized in creating the patients health plan today.    ED Course as of 03/09/22 0109   Mon Mar 08, 2022   1712 HCG(Urine) Pregnancy Test: Negative [JG]   1712 Leukocyte Esterase, Urine: Negative [JG]   1712 Nitrite, Urine: Negative [JG]   1712 WBC(!): 12.9 [JG]   1712 Hemoglobin Quant(!): 10.2 [JG]   1815 Procalcitonin: 0.03 [JG]   1815 Lactic Acid: 1.60 [JG]   1817 Lipase: 13 [JG]   1817 Patient has leukocytosis with a white count of 12.9 on CBC.  Mild anemia with a hemoglobin of 10.2.   [JG]   1818 On CMP, no electrolyte derangements, no sign of AKI, no elevation of ALT AST alk phos or bilirubin [JG]   1818 Urinalysis negative for nitrite, negative for leukocyte esterase.  No blood.  Urine micro  negative for WBCs.  There is 2+ bacteria.  I do attribute 2+ bacteria to clue cells noted on wet prep. [JG]   1818 Wet prep negative for yeast, negative for trichomoniasis.  Positive for few clue cells [JG]   1818 Lactic acid within normal limits at 1.6.  Pro-Cal within normal limits at 0.3 [JG]   2001 Patient states her pain is easing up with Toradol. [JG]   2056 Ct abd/pelvis: COMPARISON: None     FINDINGS:  - LUNG BASES: No infiltrates or masses.     - LIVER: Normal in size and appearance.    - GALLBLADDER/BILE DUCTS: No gallstones or bile duct dilatation.  - PANCREAS: Normal.  - SPLEEN: Normal.     -  ADRENALS: Normal.  - KIDNEYS/URETERS: No hydronephrosis or significant mass.  - BLADDER: Normal.  - REPRODUCTIVE ORGANS: The uterus is lobular in contour with multiple masses  measuring up to 5.7 cm extending superiorly off the uterus. These likely reflect  fibroids. There is slight induration of the left adnexa. Mild adjacent small  bowel wall thickening is noted.     - BOWEL: Normal caliber.  No inflammatory changes.  - LYMPH NODES: No significant retroperitoneal, mesenteric, or pelvic adenopathy.  - BONES: No fracture or significant bone lesion.  - VASCULATURE: Normal  - OTHER: No ascites.     IMPRESSION:     There is slight nonspecific induration in the region of the left adnexal fat.  This may be due to scarring from prior oophorectomy however infectious or  inflammatory process not excluded. Pelvic ultrasound could evaluate further if  clinically indicated.  Alternatively, there is mild small bowel wall thickening  involving a loop of bowel within in the region of the left adnexa which may be  reactive. This may reflect focal enteritis as opposed to adnexal process.     Lobular uterus demonstrating multiple masses measuring up to 5.7 cm. These  likely reflect fibroids.      [JG]   2103 I was just notified that ultrasound tech will be 2 hours before they get here. [JG]   2302 Patient states that her pain is  coming back some [JG]   2359 Currently waiting on pelvic ultrasound to be read by radiology [JG]   Tue Mar 09, 2022   0045 CT tech spoke with the radiologist.  They are going to read the study in approximately 5    Patient states she is still doing well at this time. [JG]   0100 Pelvic ultrasound:Real-time transabdominal and transvaginal ultrasound of the pelvis demonstrate 2  hypoechoic masses in the uterus measuring 3.3 x 5.4 x 4.3 cm and 1.6 x 1.6 x 1.3  cm in the body of the uterus. These could represent leiomyoma.     The uterus measures 13.6 x 6.1 x 5.2 cm. Limited evaluations of the endometrium.     The ovaries are not visualized.     IMPRESSION:  Fibroid uterus.     No visualizations of the ovaries which consistent with prior history of  oophorectomy.     No free pelvic fluid adnexal mass.   [JG]   0106 Discussed with patient imaging findings today.  Discussed that she has enteritis.  Discussed that we could admit her for some IV antibiotics and pain control.  Discussed that we could also try a treatment at home with antibiotics and oral pain meds.  Patient has been conservative regarding pain management here in the emergency department as she has been wanting to drive home.    Patient states that when she first got here her pain was pretty severe.  States that pain is improved.  Suspect this is likely cause for patient's elevated heart rate. [JG]      ED Course User Index  [JG] Barbara Client, PA       Is this patient to be included in the SEP-1 core measure due to severe sepsis or septic shock? {SEP1 yes/no:60681}     History      36 year old female with history of cesarean section, abdominal hernia repair, tubal ligation, left oophorectomy presents to the emergency department today with chief complaint of 2 days of severe left-sided lower abdominal pain/pelvic pain.  Patient states pain started  in the left flank and was sharp in nature.  Patient states now pain is constant and in the left side of her  pelvis.  Patient denies any vaginal discharge, vaginal pain, dysuria or urinary frequency.  Denies any known fevers or chills denies nausea vomiting or diarrhea.  Patient states laughing makes abdomen hurt, hitting bumps in the car makes abdomen hurt.    The history is provided by the patient. No language interpreter was used.        Review of Systems   Constitutional:  Negative for chills, fatigue and fever.   HENT:  Negative for congestion.    Respiratory:  Negative for cough and shortness of breath.    Cardiovascular:  Negative for chest pain and palpitations.   Gastrointestinal:  Positive for abdominal pain. Negative for diarrhea, nausea and vomiting.   Genitourinary:  Positive for flank pain (Now resolved) and pelvic pain (Left side). Negative for dysuria.   Skin:  Negative for color change.   Neurological:  Negative for weakness and headaches.   All other systems reviewed and are negative.      Physical Exam     Vitals signs and nursing note reviewed:  Vitals:    03/08/22 1634 03/08/22 2100 03/08/22 2130 03/09/22 0058   BP: (!) 156/99 131/83 134/79    Pulse: 100   87   Resp: 18      Temp: 98.6 F (37 C)      TempSrc: Oral      SpO2: 100% 98% 99%    Weight: 117.9 kg (260 lb)      Height: 1.651 m (_0 )         Physical Exam  Vitals and nursing note reviewed.   Constitutional:       General: She is not in acute distress.     Appearance: Normal appearance. She is obese. She is not ill-appearing, toxic-appearing or diaphoretic.      Comments: Well-appearing 36 year old female.   HENT:      Head: Atraumatic.      Nose: Nose normal.      Mouth/Throat:      Mouth: Mucous membranes are moist.      Pharynx: Oropharynx is clear.   Eyes:      Extraocular Movements: Extraocular movements intact.      Conjunctiva/sclera: Conjunctivae normal.   Cardiovascular:      Rate and Rhythm: Tachycardia present.      Pulses: Normal pulses.      Heart sounds: Normal heart sounds.   Pulmonary:      Effort: Pulmonary effort is normal.       Breath sounds: Normal breath sounds.   Abdominal:      General: Bowel sounds are normal.      Palpations: Abdomen is soft.      Tenderness: There is abdominal tenderness (Tenderness in the left side of pelvis and left lower quadrant.  There is some mild tenderness in periumbilical region as well.). There is no right CVA tenderness, left CVA tenderness, guarding or rebound.   Musculoskeletal:         General: Normal range of motion.      Cervical back: Normal range of motion.   Skin:     General: Skin is warm and dry.      Capillary Refill: Capillary refill takes less than 2 seconds.      Coloration: Skin is not jaundiced or pale.   Neurological:      General: No focal  deficit present.      Mental Status: She is alert and oriented to person, place, and time.   Psychiatric:         Mood and Affect: Mood normal.         Behavior: Behavior normal.         Thought Content: Thought content normal.         Judgment: Judgment normal.          Procedures     Procedures    Orders Placed This Encounter   Procedures   . Wet prep, genital   . Culture, Blood 1   . Culture, Blood 1   . CT ABDOMEN PELVIS W IV CONTRAST Additional Contrast? None   . US PELVIS COMPLETE NON-OB TRANSABDOMINAL AND TRANSVAGINAL   . CBC with Diff   . CMP   . Lipase   . Urinalysis w rflx microscopic   . Pregnancy, Urine   . Urinalysis, Micro   . Procalcitonin   . Lactic Acid   . Diet NPO   . Saline lock IV        Medications given during this emergency department visit:  Medications   sodium chloride 0.9 % bolus 1,000 mL (1,000 mLs IntraVENous New Bag 03/08/22 1825)   cefTRIAXone (ROCEPHIN) 1,000 mg in sterile water 10 mL IV syringe (1,000 mg IntraVENous Given 03/08/22 1830)   ketorolac (TORADOL) injection 30 mg (30 mg IntraVENous Given 03/08/22 1830)   acetaminophen (TYLENOL) tablet 1,000 mg (1,000 mg Oral Given 03/08/22 2334)       New Prescriptions    CEFDINIR (OMNICEF) 300 MG CAPSULE    Take 1 capsule by mouth 2 times daily for 7 days     HYDROCODONE-ACETAMINOPHEN (NORCO) 5-325 MG PER TABLET    Take 1 tablet by mouth every 6 hours as needed for Pain for up to 3 days. Intended supply: 3 days. Take lowest dose possible to manage pain Max Daily Amount: 4 tablets    METRONIDAZOLE (FLAGYL) 500 MG TABLET    Take 1 tablet by mouth 2 times daily for 7 days    ONDANSETRON (ZOFRAN) 4 MG TABLET    Take 1 tablet by mouth 3 times daily as needed for Nausea or Vomiting        No past medical history on file.     Past Surgical History:   Procedure Laterality Date   . CESAREAN SECTION     . HERNIA REPAIR     . TUBAL LIGATION          Social History     Socioeconomic History   . Marital status: Single   Tobacco Use   . Smoking status: Every Day     Packs/day: .25     Types: Cigarettes   . Smokeless tobacco: Never   Substance and Sexual Activity   . Alcohol use: Never        Previous Medications    No medications on file        Results for orders placed or performed during the hospital encounter of 03/08/22   Wet prep, genital    Specimen: Vaginal   Result Value Ref Range    Special Requests NO SPECIAL REQUESTS      Wet Prep FEW  WBC'S        Wet Prep NO YEAST SEEN      Wet Prep NO TRICHOMONAS SEEN      Wet Prep FEW  CLUE CELLS PRESENT  Culture, Blood 1    Specimen: Blood   Result Value Ref Range    Special Requests RIGHT  Antecubital        Culture PENDING    CT ABDOMEN PELVIS W IV CONTRAST Additional Contrast? None    Narrative    CT OF THE ABDOMEN AND PELVIS    INDICATION: Left lower quadrant and left-sided abdominal pain. History of left  oophorectomy. Leukocytosis.    Multiple axial images were obtained through the abdomen and pelvis.  Oral  contrast was used for bowel opacification.  159m of Isovue 370 intravenous  contrast was used for better evaluation of solid organs and vascular structures.   Radiation dose reduction techniques were used for this study.  All CT scans  performed at this facility use one or all of the following: Automated  exposure  control, adjustment of the mA and/or kVp according to patient's size, iterative  reconstruction.    COMPARISON: None    FINDINGS:  - LUNG BASES: No infiltrates or masses.    - LIVER: Normal in size and appearance.    - GALLBLADDER/BILE DUCTS: No gallstones or bile duct dilatation.  - PANCREAS: Normal.  - SPLEEN: Normal.    - ADRENALS: Normal.  - KIDNEYS/URETERS: No hydronephrosis or significant mass.  - BLADDER: Normal.  - REPRODUCTIVE ORGANS: The uterus is lobular in contour with multiple masses  measuring up to 5.7 cm extending superiorly off the uterus. These likely reflect  fibroids. There is slight induration of the left adnexa. Mild adjacent small  bowel wall thickening is noted.    - BOWEL: Normal caliber.  No inflammatory changes.  - LYMPH NODES: No significant retroperitoneal, mesenteric, or pelvic adenopathy.  - BONES: No fracture or significant bone lesion.  - VASCULATURE: Normal  - OTHER: No ascites.      Impression    There is slight nonspecific induration in the region of the left adnexal fat.  This may be due to scarring from prior oophorectomy however infectious or  inflammatory process not excluded. Pelvic ultrasound could evaluate further if  clinically indicated.  Alternatively, there is mild small bowel wall thickening  involving a loop of bowel within in the region of the left adnexa which may be  reactive. This may reflect focal enteritis as opposed to adnexal process.    Lobular uterus demonstrating multiple masses measuring up to 5.7 cm. These  likely reflect fibroids.      If there are any questions about this report, I can be reached on [employer]  or at [phone]     [place] COMPLETE NON-OB TRANSABDOMINAL AND TRANSVAGINAL    Narrative    HISTORY: Abnormal CT of the pelvis.    FINDINGS:  Real-time transabdominal and transvaginal ultrasound of the pelvis demonstrate 2  hypoechoic masses in the uterus measuring 3.3 x 5.4 x 4.3 cm and 1.6 x 1.6 x 1.3  cm in the body of the uterus.  These could represent leiomyoma.    The uterus measures 13.6 x 6.1 x 5.2 cm. Limited evaluations of the endometrium.    The ovaries are not visualized.      Impression    Fibroid uterus.    No visualizations of the ovaries which consistent with prior history of  oophorectomy.    No free pelvic fluid adnexal mass.   CBC with Diff   Result Value Ref Range    WBC 12.9 (H) 4.3 - 11.1 K/uL    RBC 3.77 (L) 4.05 - 5.20  M/uL    Hemoglobin 10.2 (L) 11.7 - 15.4 g/dL    Hematocrit 31.8 (L) 35.8 - 46.3 %    MCV 84.4 82.0 - 102.0 FL    MCH 27.1 26.1 - 32.9 PG    MCHC 32.1 31.4 - 35.0 g/dL    RDW 14.0 11.9 - 14.6 %    Platelets 352 150 - 450 K/uL    MPV 8.8 (L) 9.4 - 12.3 FL    nRBC 0.00 0.0 - 0.2 K/uL    Differential Type AUTOMATED      Neutrophils % 75 43 - 78 %    Lymphocytes % 18 13 - 44 %    Monocytes % 6 4.0 - 12.0 %    Eosinophils % 1 0.5 - 7.8 %    Basophils % 0 0.0 - 2.0 %    Immature Granulocytes 0 0.0 - 5.0 %    Neutrophils Absolute 9.7 (H) 1.7 - 8.2 K/UL    Lymphocytes Absolute 2.3 0.5 - 4.6 K/UL    Monocytes Absolute 0.7 0.1 - 1.3 K/UL    Eosinophils Absolute 0.1 0.0 - 0.8 K/UL    Basophils Absolute 0.0 0.0 - 0.2 K/UL    Absolute Immature Granulocyte 0.0 0.0 - 0.5 K/UL   CMP   Result Value Ref Range    Sodium 140 133 - 143 mmol/L    Potassium 3.5 3.5 - 5.1 mmol/L    Chloride 104 98 - 107 mmol/L    CO2 25 21 - 32 mmol/L    Anion Gap 11 2 - 11 mmol/L    Glucose 97 65 - 100 mg/dL    BUN 10 6 - 23 MG/DL    Creatinine 0.63 0.6 - 1.0 MG/DL    Est, Glom Filt Rate >60 >60 ml/min/1.43m    Calcium 9.3 8.3 - 10.4 MG/DL    Total Bilirubin 0.2 0.2 - 1.1 MG/DL    ALT 13 13.0 - 61.0 U/L    AST 19 15 - 37 U/L    Alk Phosphatase 71 45.0 - 117.0 U/L    Total Protein 8.0 6.4 - 8.2 g/dL    Albumin 4.2 3.5 - 5.0 g/dL    Globulin 3.8 2.8 - 4.5 g/dL    Albumin/Globulin Ratio 1.1 0.4 - 1.6     Lipase   Result Value Ref Range    Lipase 13 13 - 60 U/L   Urinalysis w rflx microscopic   Result Value Ref Range    Color, UA YELLOW       Appearance CLEAR      Specific Gravity, UA 1.020 1.001 - 1.023      pH, Urine 7.5 5.0 - 9.0      Protein, UA 30 (A) NEG mg/dL    Glucose, UA Negative mg/dL    Ketones, Urine Negative NEG mg/dL    Bilirubin Urine Negative NEG      Blood, Urine Negative NEG      Urobilinogen, Urine >=8.0 0.2 - 1.0 EU/dL    Nitrite, Urine Negative NEG      Leukocyte Esterase, Urine Negative NEG     Pregnancy, Urine   Result Value Ref Range    HCG(Urine) Pregnancy Test Negative     Urinalysis, Micro   Result Value Ref Range    WBC, UA 0-3 0 /hpf    RBC, UA 0-3 0 /hpf    Epithelial Cells UA 0-3 0 /hpf    BACTERIA, URINE 2+ (H) 0 /hpf    Casts  0 0 /lpf    Crystals 0 0 /LPF    Mucus, UA 0 0 /lpf    Other observations RESULTS VERIFIED MANUALLY     Procalcitonin   Result Value Ref Range    Procalcitonin 0.03 0.00 - 0.49 ng/mL   Lactic Acid   Result Value Ref Range    Lactic Acid 1.60 0.4 - 2.0 mmol/L        US PELVIS COMPLETE NON-OB TRANSABDOMINAL AND TRANSVAGINAL   Final Result   Fibroid uterus.      No visualizations of the ovaries which consistent with prior history of   oophorectomy.      No free pelvic fluid adnexal mass.      CT ABDOMEN PELVIS W IV CONTRAST Additional Contrast? None   Final Result      There is slight nonspecific induration in the region of the left adnexal fat.   This may be due to scarring from prior oophorectomy however infectious or   inflammatory process not excluded. Pelvic ultrasound could evaluate further if   clinically indicated.  Alternatively, there is mild small bowel wall thickening   involving a loop of bowel within in the region of the left adnexa which may be   reactive. This may reflect focal enteritis as opposed to adnexal process.      Lobular uterus demonstrating multiple masses measuring up to 5.7 cm. These   likely reflect fibroids.         If there are any questions about this report, I can be reached on [employer]   or at [phone]                           Voice dictation software was used  during the making of this note.  This software is not perfect and grammatical and other typographical errors may be present.  This note has not been completely proofread for errors.

## 2022-03-08 NOTE — ED Triage Notes (Signed)
Pt presents to the ER with steady gait with c/o left sided pelvic pain x 2 days.  Pt with tubal in 2015.  Pt denies dysuria, hematuria, vaginal discharge or concern for STI.   Pt took 1000mg  APAP at 0730 today.

## 2022-03-09 MED ORDER — CEFDINIR 300 MG PO CAPS
300 MG | ORAL_CAPSULE | Freq: Two times a day (BID) | ORAL | 0 refills | Status: AC
Start: 2022-03-09 — End: 2022-03-16

## 2022-03-09 MED ORDER — HYDROCODONE-ACETAMINOPHEN 5-325 MG PO TABS
5-325 MG | ORAL_TABLET | Freq: Four times a day (QID) | ORAL | 0 refills | Status: AC | PRN
Start: 2022-03-09 — End: 2022-03-12

## 2022-03-09 MED ORDER — ONDANSETRON HCL 4 MG PO TABS
4 MG | ORAL_TABLET | Freq: Three times a day (TID) | ORAL | 0 refills | Status: AC | PRN
Start: 2022-03-09 — End: 2022-03-14

## 2022-03-09 MED ORDER — METRONIDAZOLE 500 MG PO TABS
500 MG | ORAL_TABLET | Freq: Two times a day (BID) | ORAL | 0 refills | Status: AC
Start: 2022-03-09 — End: 2022-03-16

## 2022-03-09 MED ORDER — ACETAMINOPHEN 500 MG PO TABS
500 MG | ORAL | Status: AC
Start: 2022-03-09 — End: 2022-03-08
  Administered 2022-03-09: 05:00:00 1000 mg via ORAL

## 2022-03-09 MED FILL — TYLENOL EXTRA STRENGTH 500 MG PO TABS: 500 MG | ORAL | Qty: 2

## 2022-03-09 NOTE — Discharge Instructions (Addendum)
Evaluated today for abdominal/pelvic pain    Overall blood work is reassuring.  You do have an elevated white count.  Rest of your lab work is reassuring.    Workup today is consistent with likely enteritis.  This is an infection of your small bowel.  It is small focal area.    I have written you a prescription for antibiotics today called Flagyl and Omnicef.  Take these medications with small amount of bland food.    Do not drink alcohol on Flagyl.    I have written you prescription for Norco which is a narcotic pain medication.  Do not drink alcohol or drive on this medication.  This medication can can sometimes cause Grogginess, fogginess, sleepiness.    Contact Dr. Hale Drone in the morning.  She is a GYN.  You can follow-up and try to establish care for your uterine fibroids    Contact the number listed below and they will help you establish primary care as well.  I do recommend  liquid diet for the next 3 days.  Follow this with slow introduction of bland foods.    Please return to the emergency dep   Artment if you have been on antibiotics for over 72 hours, you are developing fevers, bloody diarrhea, abdominal pain is becoming severe, general worsening of your condition    We would love to help you get a primary care doctor for follow-up after your emergency department visit.    Please call [phone] between 7AM - 6PM Monday to Friday.  A care navigator will be able to assist you with setting up a doctor close to your home.

## 2022-03-14 LAB — CULTURE, BLOOD 1
Culture: NO GROWTH
Culture: NO GROWTH

## 2022-10-13 ENCOUNTER — Inpatient Hospital Stay
Admit: 2022-10-13 | Discharge: 2022-10-13 | Disposition: A | Discharge status: Home / Self Care | Payer: BLUE CROSS/BLUE SHIELD

## 2022-10-13 ENCOUNTER — Emergency Department
Admit: 2022-10-13 | Payer: BLUE CROSS/BLUE SHIELD | Primary: Student in an Organized Health Care Education/Training Program

## 2022-10-13 DIAGNOSIS — R1032 Left lower quadrant pain: Secondary | ICD-10-CM

## 2022-10-13 LAB — CBC WITH AUTO DIFFERENTIAL
Basophils %: 0 % (ref 0.0–2.0)
Basophils Absolute: 0 10*3/uL (ref 0.0–0.2)
Eosinophils %: 3 % (ref 0.5–7.8)
Eosinophils Absolute: 0.2 10*3/uL (ref 0.0–0.8)
Hematocrit: 33.7 % — ABNORMAL LOW (ref 35.8–46.3)
Hemoglobin: 10.9 g/dL — ABNORMAL LOW (ref 11.7–15.4)
Immature Granulocytes %: 0 % (ref 0.0–5.0)
Immature Granulocytes Absolute: 0 10*3/uL (ref 0.0–0.5)
Lymphocytes %: 34 % (ref 13–44)
Lymphocytes Absolute: 3 10*3/uL (ref 0.5–4.6)
MCH: 26.2 PG (ref 26.1–32.9)
MCHC: 32.3 g/dL (ref 31.4–35.0)
MCV: 81 FL — ABNORMAL LOW (ref 82.0–102.0)
MPV: 9.1 FL — ABNORMAL LOW (ref 9.4–12.3)
Monocytes %: 6 % (ref 4.0–12.0)
Monocytes Absolute: 0.5 10*3/uL (ref 0.1–1.3)
Neutrophils %: 58 % (ref 43–78)
Neutrophils Absolute: 5 10*3/uL (ref 1.7–8.2)
Platelets: 381 10*3/uL (ref 150–450)
RBC: 4.16 M/uL (ref 4.05–5.20)
RDW: 14.8 % — ABNORMAL HIGH (ref 11.9–14.6)
WBC: 8.7 10*3/uL (ref 4.3–11.1)
nRBC: 0 10*3/uL (ref 0.0–0.2)

## 2022-10-13 LAB — COMPREHENSIVE METABOLIC PANEL
ALT: 14 U/L (ref 13.0–61.0)
AST: 26 U/L (ref 15–37)
Albumin/Globulin Ratio: 1.2 (ref 0.4–1.6)
Albumin: 4.2 g/dL (ref 3.5–5.0)
Alk Phosphatase: 88 U/L (ref 45.0–117.0)
Anion Gap: 12 mmol/L — ABNORMAL HIGH (ref 2–11)
BUN: 10 MG/DL (ref 6–23)
CO2: 22 mmol/L (ref 21–32)
Calcium: 9.2 MG/DL (ref 8.3–10.4)
Chloride: 102 mmol/L (ref 98–107)
Creatinine: 0.52 MG/DL — ABNORMAL LOW (ref 0.6–1.0)
Est, Glom Filt Rate: >90 mL/min/{1.73_m2} (ref >60)
Globulin: 3.5 g/dL (ref 2.8–4.5)
Glucose: 93 mg/dL (ref 65–100)
Potassium: 3.2 mmol/L — ABNORMAL LOW (ref 3.5–5.1)
Sodium: 136 mmol/L (ref 133–143)
Total Bilirubin: 0.2 MG/DL (ref 0.2–1.1)
Total Protein: 7.7 g/dL (ref 6.4–8.2)

## 2022-10-13 LAB — URINALYSIS
Bilirubin, Urine: NEGATIVE
Blood, Urine: NEGATIVE
Glucose, Ur: NEGATIVE mg/dL
Ketones, Urine: NEGATIVE mg/dL
Leukocyte Esterase, Urine: NEGATIVE
Nitrite, Urine: NEGATIVE
Protein, UA: NEGATIVE mg/dL
Specific Gravity, UA: 1.025 — ABNORMAL HIGH (ref 1.001–1.023)
Urobilinogen, Urine: 0.2 EU/dL (ref 0.2–1.0)
pH, Urine: 6 (ref 5.0–9.0)

## 2022-10-13 LAB — PREGNANCY, URINE: Pregnancy, Urine: NEGATIVE

## 2022-10-13 LAB — LIPASE: Lipase: 20 U/L (ref 13–60)

## 2022-10-13 LAB — MAGNESIUM: Magnesium: 1.7 mg/dL (ref 1.2–2.6)

## 2022-10-13 MED ORDER — KETOROLAC TROMETHAMINE 30 MG/ML IJ SOLN
30 | INTRAMUSCULAR | Status: AC
Start: 2022-10-13 — End: 2022-10-13
  Administered 2022-10-13: 18:00:00 30 mg via INTRAVENOUS

## 2022-10-13 MED ORDER — ONDANSETRON HCL 4 MG PO TABS
4 | ORAL_TABLET | Freq: Three times a day (TID) | ORAL | 0 refills | Status: AC | PRN
Start: 2022-10-13 — End: ?

## 2022-10-13 MED ORDER — HYOSCYAMINE SULFATE 0.125 MG PO TABS
125 | ORAL_TABLET | ORAL | 0 refills | Status: AC | PRN
Start: 2022-10-13 — End: ?

## 2022-10-13 MED ORDER — IOPAMIDOL 76 % IV SOLN
76 | Freq: Once | INTRAVENOUS | Status: AC | PRN
Start: 2022-10-13 — End: 2022-10-13
  Administered 2022-10-13: 19:00:00 100 mL via INTRAVENOUS

## 2022-10-13 MED ORDER — SODIUM CHLORIDE 0.9 % IV BOLUS
0.9 | INTRAVENOUS | Status: AC
Start: 2022-10-13 — End: 2022-10-13
  Administered 2022-10-13: 18:00:00 1000 mL via INTRAVENOUS

## 2022-10-13 MED ORDER — ONDANSETRON HCL 4 MG/2ML IJ SOLN
4 | INTRAMUSCULAR | Status: AC
Start: 2022-10-13 — End: 2022-10-13
  Administered 2022-10-13: 18:00:00 4 mg via INTRAVENOUS

## 2022-10-13 MED FILL — KETOROLAC TROMETHAMINE 30 MG/ML IJ SOLN: 30 MG/ML | INTRAMUSCULAR | Qty: 1

## 2022-10-13 MED FILL — ONDANSETRON HCL 4 MG/2ML IJ SOLN: 4 MG/2ML | INTRAMUSCULAR | Qty: 2

## 2022-10-13 NOTE — ED Triage Notes (Signed)
Pt coming in for LLQ pain with nausea and vomiting. Pt states nausea started about a week agio and pain started last night. Pt states she's coming in because she threw up randomly multiple times today. Pt has history of tubes being tide.

## 2022-10-13 NOTE — Discharge Instructions (Signed)
Use Zofran for nausea and Levsin for any abdominal cramping.  See your primary care physician for routine recheck.  Return to ER for any worsening symptoms

## 2022-10-13 NOTE — ED Provider Notes (Signed)
Emergency Department Provider Note       PCP: Janae Bridgeman, DO   Age: 37 y.o.   Sex: female     DISPOSITION Decision To Discharge 10/13/2022 04:11:43 PM       ICD-10-CM    1. Left lower quadrant abdominal pain  R10.32       2. Nausea and vomiting, unspecified vomiting type  R11.2           Medical Decision Making     37 year old female with nausea vomiting left lower abdominal pain.  History of colitis.  Patient was afebrile vital signs were normal.  CBC CMP lipase urinalysis pregnancy test were negative.  Patient was given a liter normal saline Toradol and Zofran IV CT of the abdomen pelvis was negative.  Patient is feeling better.  This may be due to some mild abdominal cramping will treat with Levsin light diet strict return to ED precautions i.e. fever vomiting severe pain diarrhea.  Work note given.     1 or more acute illnesses that pose a threat to life or bodily function.   Prescription drug management performed.  Shared medical decision making was utilized in creating the patients health plan today.    I independently ordered and reviewed each unique test.  I reviewed external records: provider visit note from PCP.     I interpreted the CT Scan CT abdomen pelvis negative read by radiologist.  I interpreted the baseline labs to include urinalysis and pregnancy test.              History     37 year old female to ER with left lower abdominal pain nausea vomiting.  She states she has had nausea and some pain off and on for the past week she started vomiting today.  She denies any fever no urinary symptoms has not noticed any dark or blood in her urine no black stools no meds taken for symptoms she has remote history of colitis.  She has had tubal ligation          ROS     Review of Systems   All other systems reviewed and are negative.       Physical Exam     Vitals signs and nursing note reviewed:  Vitals:    10/13/22 1428 10/13/22 1528 10/13/22 1548 10/13/22 1603   BP: 131/60 127/83 125/86 131/81    Pulse:       Resp:       Temp:       TempSrc:       SpO2: 95% 100% 99% 100%   Weight:       Height:          Physical Exam  Vitals and nursing note reviewed.   Constitutional:       Appearance: Normal appearance. She is well-developed and normal weight.   HENT:      Head: Normocephalic and atraumatic.      Right Ear: External ear normal.      Left Ear: External ear normal.      Nose: Nose normal.      Mouth/Throat:      Mouth: Mucous membranes are moist.      Pharynx: Oropharynx is clear.   Eyes:      Extraocular Movements: Extraocular movements intact.      Pupils: Pupils are equal, round, and reactive to light.   Cardiovascular:      Rate and Rhythm: Normal rate and regular rhythm.  Pulses: Normal pulses.      Heart sounds: Normal heart sounds.   Pulmonary:      Effort: Pulmonary effort is normal.      Breath sounds: Normal breath sounds. No rales.   Chest:      Chest wall: No tenderness.   Abdominal:      General: Abdomen is flat. Bowel sounds are normal.      Palpations: Abdomen is soft.      Tenderness: There is abdominal tenderness in the left lower quadrant.      Hernia: No hernia is present.      Comments: Mild tenderness to left lower quadrant area no guarding or masses no skin changes no flank pain no radiation of pain to the suprapubic area or to the right lower quadrant.  No hernias   Musculoskeletal:         General: Normal range of motion.      Cervical back: Normal range of motion and neck supple.   Skin:     General: Skin is warm and dry.   Neurological:      General: No focal deficit present.      Mental Status: She is alert.   Psychiatric:         Mood and Affect: Mood normal.         Behavior: Behavior normal.        Procedures     Procedures    Orders Placed This Encounter   Procedures    CT ABDOMEN PELVIS W IV CONTRAST Additional Contrast? None    CBC with Auto Differential    CMP    Lipase    Magnesium    Urinalysis w rflx microscopic    Pregnancy, Urine        Medications given during  this emergency department visit:  Medications   ketorolac (TORADOL) injection 30 mg (30 mg IntraVENous Given 10/13/22 1424)   ondansetron (ZOFRAN) injection 4 mg (4 mg IntraVENous Given 10/13/22 1424)   sodium chloride 0.9 % bolus 1,000 mL (0 mLs IntraVENous Stopped 10/13/22 1454)   iopamidol (ISOVUE-370) 76 % injection 100 mL (100 mLs IntraVENous Given 10/13/22 1434)       Discharge Medication List as of 10/13/2022  3:38 PM        START taking these medications    Details   ondansetron (ZOFRAN) 4 MG tablet Take 1 tablet by mouth 3 times daily as needed for Nausea or Vomiting, Disp-15 tablet, R-0Normal      hyoscyamine (ANASPAZ;LEVSIN) 125 MCG tablet Take 1 tablet by mouth every 4 hours as needed for Cramping, Disp-20 tablet, R-0Normal              No past medical history on file.     Past Surgical History:   Procedure Laterality Date    CESAREAN SECTION      HERNIA REPAIR      TUBAL LIGATION          Social History     Socioeconomic History    Marital status: Single   Tobacco Use    Smoking status: Every Day     Current packs/day: 0.25     Types: Cigarettes    Smokeless tobacco: Never   Substance and Sexual Activity    Alcohol use: Never        Discharge Medication List as of 10/13/2022  3:38 PM           Results for orders placed or performed during the  hospital encounter of 10/13/22   CT ABDOMEN PELVIS W IV CONTRAST Additional Contrast? None    Narrative    CT OF THE ABDOMEN AND PELVIS    INDICATION: Left lower quadrant pain    TECHNIQUE: Multiple 2D axial images were obtained through the abdomen and  pelvis.  Oral contrast was used for bowel opacification.  of Isovue 370  intravenous contrast was used for better evaluation of solid organs and vascular  structures.  Radiation dose reduction techniques were used for this study.  All  CT scans performed at this facility use one or all of the following: Automated  exposure control, adjustment of the mA and/or kVp according to patient's size,  iterative  reconstruction.    COMPARISON: CT abdomen pelvis March 08, 2022    FINDINGS:  - LUNG BASES: No infiltrates or masses.    - LIVER: Normal in size and appearance.    - GALLBLADDER/BILE DUCTS: No gallstones or bile duct dilatation.  - PANCREAS: Normal.  - SPLEEN: Normal.    - ADRENALS: Normal.  - KIDNEYS/URETERS: No hydronephrosis or significant mass.  - BLADDER: Normal.  - REPRODUCTIVE ORGANS: No pelvic masses.    - BOWEL: Normal caliber.  No inflammatory changes.  - LYMPH NODES: No significant retroperitoneal, mesenteric, or pelvic adenopathy.  - BONES: No fracture or significant bone lesion. Bilateral sacroiliitis.  - VASCULATURE: Normal  - OTHER: No ascites. Uterine fibroids measuring up to 6 cm in diameter.    Impression    No evidence of acute intra-abdominal pathology.    Uterine fibroids measuring up to 6 cm in diameter.        Electronically signed by JEFFREY N MASI   CBC with Auto Differential   Result Value Ref Range    WBC 8.7 4.3 - 11.1 K/uL    RBC 4.16 4.05 - 5.20 M/uL    Hemoglobin 10.9 (L) 11.7 - 15.4 g/dL    Hematocrit 09.8 (L) 35.8 - 46.3 %    MCV 81.0 (L) 82.0 - 102.0 FL    MCH 26.2 26.1 - 32.9 PG    MCHC 32.3 31.4 - 35.0 g/dL    RDW 11.9 (H) 14.7 - 14.6 %    Platelets 381 150 - 450 K/uL    MPV 9.1 (L) 9.4 - 12.3 FL    nRBC 0.00 0.0 - 0.2 K/uL    Differential Type AUTOMATED      Neutrophils % 58 43 - 78 %    Lymphocytes % 34 13 - 44 %    Monocytes % 6 4.0 - 12.0 %    Eosinophils % 3 0.5 - 7.8 %    Basophils % 0 0.0 - 2.0 %    Immature Granulocytes % 0 0.0 - 5.0 %    Neutrophils Absolute 5.0 1.7 - 8.2 K/UL    Lymphocytes Absolute 3.0 0.5 - 4.6 K/UL    Monocytes Absolute 0.5 0.1 - 1.3 K/UL    Eosinophils Absolute 0.2 0.0 - 0.8 K/UL    Basophils Absolute 0.0 0.0 - 0.2 K/UL    Immature Granulocytes Absolute 0.0 0.0 - 0.5 K/UL   CMP   Result Value Ref Range    Sodium 136 133 - 143 mmol/L    Potassium 3.2 (L) 3.5 - 5.1 mmol/L    Chloride 102 98 - 107 mmol/L    CO2 22 21 - 32 mmol/L    Anion Gap 12 (H) 2  - 11 mmol/L    Glucose 93 65 -  100 mg/dL    BUN 10 6 - 23 MG/DL    Creatinine 6.04 (L) 0.6 - 1.0 MG/DL    Est, Glom Filt Rate >90 >60 ml/min/1.24m2    Calcium 9.2 8.3 - 10.4 MG/DL    Total Bilirubin 0.2 0.2 - 1.1 MG/DL    ALT 14 54.0 - 98.1 U/L    AST 26 15 - 37 U/L    Alk Phosphatase 88 45.0 - 117.0 U/L    Total Protein 7.7 6.4 - 8.2 g/dL    Albumin 4.2 3.5 - 5.0 g/dL    Globulin 3.5 2.8 - 4.5 g/dL    Albumin/Globulin Ratio 1.2 0.4 - 1.6     Lipase   Result Value Ref Range    Lipase 20 13 - 60 U/L   Magnesium   Result Value Ref Range    Magnesium 1.7 1.2 - 2.6 mg/dL   Urinalysis w rflx microscopic   Result Value Ref Range    Color, UA YELLOW      Appearance CLEAR      Specific Gravity, UA 1.025 (H) 1.001 - 1.023      pH, Urine 6.0 5.0 - 9.0      Protein, UA Negative NEG mg/dL    Glucose, Ur Negative mg/dL    Ketones, Urine Negative NEG mg/dL    Bilirubin, Urine Negative NEG      Blood, Urine Negative NEG      Urobilinogen, Urine 0.2 0.2 - 1.0 EU/dL    Nitrite, Urine Negative NEG      Leukocyte Esterase, Urine Negative NEG     Pregnancy, Urine   Result Value Ref Range    Pregnancy, Urine Negative           CT ABDOMEN PELVIS W IV CONTRAST Additional Contrast? None   Final Result   No evidence of acute intra-abdominal pathology.      Uterine fibroids measuring up to 6 cm in diameter.            Electronically signed by JEFFREY N MASI                   No results for input(s): "COVID19" in the last 72 hours.    Voice dictation software was used during the making of this note.  This software is not perfect and grammatical and other typographical errors may be present.  This note has not been completely proofread for errors.      Andris Flurry, [place]  10/13/22 2134

## 2022-11-16 ENCOUNTER — Inpatient Hospital Stay
Admit: 2022-11-16 | Discharge: 2022-11-16 | Disposition: A | Discharge status: Home / Self Care | Payer: BLUE CROSS/BLUE SHIELD

## 2022-11-16 DIAGNOSIS — U071 COVID-19: Secondary | ICD-10-CM

## 2022-11-16 LAB — COVID-19, RAPID: SARS-CoV-2, Rapid: DETECTED — AB

## 2022-11-16 NOTE — Discharge Instructions (Addendum)
Your Covid test resulted positive.

## 2022-11-16 NOTE — ED Triage Notes (Signed)
Pt ambulatory to triage for reports of covid test. Pt states she is here due to her work requesting one. Pt states she is having cold symptoms, cough, & congestion but believes it is her allergies.

## 2022-11-16 NOTE — ED Provider Notes (Signed)
Emergency Department Provider Note       PCP: Janae Bridgeman, DO   Age: 37 y.o.   Sex: female     DISPOSITION Decision To Discharge 11/16/2022 10:47:42 AM       ICD-10-CM    1. COVID-19  U07.1         Medical Decision Making     37 year old female with no significant past medical history presents here for COVID testing.  She is afebrile.  Her COVID test resulted positive here.     1 acute, uncomplicated illness or injury.  Over the counter drug management performed.  Patient was discharged risks and benefits of hospitalization were considered.  Shared medical decision making was utilized in creating the patients health plan today.    I independently ordered and reviewed each unique test.  I reviewed external records: ED visit note from an outside group.  I reviewed external records: provider visit note from outside specialist.  I reviewed external records: previous lab results from outside ED.            Voice dictation software was used during the making of this note.  This software is not perfect and grammatical and other typographical errors may be present.  This note has not been completely proofread for errors.    History     81 female who presents here with complaint of needing COVID test.  States her work is requesting she have 1 however she believes her symptoms can be attributed to allergies.  Her symptoms include stuffy nose, cough, chills.  Denies fever, nausea, vomiting, diarrhea.  Symptoms started on Friday    The history is provided by the patient. No language interpreter was used.     Physical Exam     Vitals signs and nursing note reviewed:  Vitals:    11/16/22 1019   BP: 130/89   Pulse: 82   Resp: 18   Temp: 98.1 F (36.7 C)   SpO2: 98%   Weight: 122.5 kg (270 lb)   Height: 1.651 m (5\' 5" )      Physical Exam  Vitals and nursing note reviewed.   Constitutional:       General: She is not in acute distress.     Appearance: Normal appearance. She is not ill-appearing, toxic-appearing or diaphoretic.    HENT:      Head: Normocephalic and atraumatic.      Nose: Congestion and rhinorrhea present.      Mouth/Throat:      Mouth: Mucous membranes are moist.   Eyes:      Pupils: Pupils are equal, round, and reactive to light.   Cardiovascular:      Rate and Rhythm: Normal rate.   Pulmonary:      Effort: Pulmonary effort is normal. No respiratory distress.   Abdominal:      General: Abdomen is flat.      Palpations: Abdomen is soft.      Tenderness: There is no abdominal tenderness.   Musculoskeletal:         General: Normal range of motion.      Cervical back: Normal range of motion. No rigidity.   Skin:     General: Skin is warm.   Neurological:      General: No focal deficit present.      Mental Status: She is alert.   Psychiatric:         Mood and Affect: Mood normal.        Procedures  Procedures    Orders Placed This Encounter   Procedures    COVID-19, Rapid        Medications given during this emergency department visit:  Medications - No data to display    New Prescriptions    No medications on file        History reviewed. No pertinent past medical history.     Past Surgical History:   Procedure Laterality Date    CESAREAN SECTION      HERNIA REPAIR      TUBAL LIGATION          Social History     Socioeconomic History    Marital status: Single     Spouse name: None    Number of children: None    Years of education: None    Highest education level: None   Tobacco Use    Smoking status: Every Day     Current packs/day: 0.25     Types: Cigarettes    Smokeless tobacco: Never   Substance and Sexual Activity    Alcohol use: Never     Social Determinants of Health     Financial Resource Strain: Low Risk  (12/17/2021)    Received from [facility], [facility]    Financial Resource Strain     Difficulty Paying Living Expenses: Not hard at all     Difficulty Paying Medical Expenses: No   Food Insecurity: No Food Insecurity (12/17/2021)    Received from [facility], [facility]    Food Insecurity     Worried  about Running Out of Food in the Last Year: Never true     Ran Out of Food in the Last Year: Never true   Transportation Needs: No Transportation Needs (12/17/2021)    Received from [facility], [facility]    Transportation Needs     Lack of Transportation: No   Physical Activity: Inactive (12/17/2021)    Received from [facility], [facility]    Physical Activity     Days of Exercise per Week: 0     Minutes of Exercise per Session: 0     Total Minutes of Exercise per Week: 0   Stress: Stress Concern Present (12/17/2021)    Received from [facility], [facility]    Stress     Feeling of Stress : To some extent   Social Connections: Moderately Integrated (12/17/2021)    Received from [facility], [facility]    Social Connections     Frequency of Communication with Friends and Family: More than three times a week     Frequency of Social Gatherings with Friends and Family: Once a week   Intimate Partner Violence: Unknown (12/17/2021)    Received from [facility], [facility]    Intimate Partner Violence     Fear of Current or Ex-Partner: Patient declined     Emotionally Abused: Patient declined     Physically Abused: Patient declined     Sexually Abused: Patient declined   Housing Stability: Not At Risk (12/17/2021)    Received from [facility], [facility]    Housing Stability     Was there a time when you did not have a steady place to sleep: No     Worried that the place you are staying is making you sick: No        Previous Medications    HYOSCYAMINE (ANASPAZ;LEVSIN) 125 MCG TABLET    Take 1 tablet by mouth every  4 hours as needed for Cramping    ONDANSETRON (ZOFRAN) 4 MG TABLET    Take 1 tablet by mouth 3 times daily as needed for Nausea or Vomiting        Results for orders placed or performed during the hospital encounter of 11/16/22   COVID-19, Rapid    Specimen: Nasopharyngeal   Result Value Ref Range    Source Nasopharyngeal      SARS-CoV-2, Rapid Detected (A) NOTD           No orders to  display                Recent Labs     11/16/22  1022   COVID19 Detected*          Alesia Richards, [place]  11/16/22 1049

## 2022-12-30 LAB — HEMOGLOBIN A1C
Estimated Avg Glucose, External: 123 mg/dL
Hemoglobin A1C, External: 5.9 % — ABNORMAL HIGH (ref 4.8–5.6)

## 2023-01-20 ENCOUNTER — Inpatient Hospital Stay
Admit: 2023-01-20 | Discharge: 2023-01-20 | Disposition: A | Discharge status: Home / Self Care | Payer: BLUE CROSS/BLUE SHIELD

## 2023-01-20 DIAGNOSIS — L03116 Cellulitis of left lower limb: Secondary | ICD-10-CM

## 2023-01-20 DIAGNOSIS — L03119 Cellulitis of unspecified part of limb: Secondary | ICD-10-CM

## 2023-01-20 MED ORDER — DOXYCYCLINE HYCLATE 100 MG PO TABS
100 | ORAL_TABLET | Freq: Two times a day (BID) | ORAL | 0 refills | Status: AC
Start: 2023-01-20 — End: 2023-01-27

## 2023-01-20 NOTE — ED Notes (Signed)
Patient mobility status  with no difficulty. Provider aware     I have reviewed discharge instructions with the patient.  The patient verbalized understanding.    Patient left ED via Discharge Method: ambulatory to Home with  self.  Marland Kitchen    Opportunity for questions and clarification provided.     Patient given 1 scripts.

## 2023-01-20 NOTE — Discharge Instructions (Signed)
Antibiotics have been sent to your pharmacy.  Please begin taking them and take them for the full course.  Return for new or worsening symptoms    As we discussed, I did not find a life threatening cause of your symptoms today. However, THAT DOES NOT MEAN IT COULD NOT DEVELOP. If you develop ANY new or worsening symptoms, it is critical that you return for re-evaluation. This includes any symptoms that are concerning to you, especially symptoms such as fevers, inability to bear weight, red streaking up your leg.  If you do not return for re-evaluation, you risk serious complications, including death.

## 2023-01-20 NOTE — ED Triage Notes (Signed)
Pt stated she has a foot infection that flairs up for the last 2 years after getting a pedicure. She noticed pus coming out of a sore on the bottom of her foot and in between her toes.

## 2023-01-20 NOTE — ED Provider Notes (Signed)
Emergency Department Provider Note       PCP: Janae Bridgeman, DO   Age: 37 y.o.   Sex: female     DISPOSITION Decision To Discharge 01/20/2023 03:31:27 PM  Condition at Disposition: Data Unavailable       ICD-10-CM    1. Cellulitis of foot  L03.119           Medical Decision Making     In summary this is a 37 year old female patient presenting for evaluation of symptoms most consistent with cellulitis of the foot.  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  However, she has a new area of erythema and purulent drainage that we will treat with antibiotics.  She does have a history of recurrent infections in this area.  She does not have any signs of lymphangitis or necrotizing infection.  She is neurovascular intact with full range of motion and no signs of joint infection.  No injury and I do not feel x-ray is indicated today.  Will treat with antibiotics.  Patient is not a diabetic.  Counseled her on signs to return for.  Patient has verbalized understanding and agreed to the plan.  Discharged in stable condition.     1 acute, uncomplicated illness or injury.  Over the counter drug management performed.  Prescription drug management performed.  Patient was discharged risks and benefits of hospitalization were considered.  Shared medical decision making was utilized in creating the patients health plan today.  ED attending physician present in department at time of care. Based on current hospital policy, their co-signature is not required on this note.   I independently ordered and reviewed each unique test.                     History     37 year old female patient presents today complaining of flareup of recurrent foot infection on the left foot that she noticed today.  States every so often she gets pain and swelling of her left fifth toe and today it started hurting.  Reports she soaked in water and got some clear drainage to come out.  Reports  it feels similar to prior exacerbations.  Reports she has taken antibiotics in the past which has fixed it.  She denies fevers.  She denies inability to bear weight.  She denies red streaking up her leg.  Patient is primary historian.    The history is provided by the patient. No language interpreter was used.       ROS     Review of Systems   Constitutional:  Negative for fever.   HENT:  Negative for facial swelling and trouble swallowing.    Eyes:  Negative for photophobia and visual disturbance.   Respiratory:  Negative for cough and shortness of breath.    Cardiovascular:  Negative for chest pain.   Gastrointestinal:  Negative for abdominal pain and vomiting.   Genitourinary:  Negative for dysuria.   Musculoskeletal:  Negative for myalgias and neck stiffness.   Skin:  Positive for wound.   Neurological:  Negative for dizziness, syncope, weakness and light-headedness.   Psychiatric/Behavioral:  Negative for self-injury and suicidal ideas.    All other systems reviewed and are negative.       Physical Exam     Vitals signs and nursing note reviewed:  Vitals:    01/20/23 1522   BP: (!) 158/98   Pulse: 94  Resp: 17   Temp: 97.9 F (36.6 C)   SpO2: 100%   Weight: 115.7 kg (255 lb)   Height: 1.651 m (5\' 5" )      Physical Exam  Vitals reviewed.   Constitutional:       General: She is not in acute distress.     Appearance: Normal appearance. She is not ill-appearing, toxic-appearing or diaphoretic.      Comments: Overall very well-appearing in no acute distress.  Even nonlabored respirations. Speaks in full sentences.  Resting comfortably in stretcher.  Ambulatory.  Well-hydrated.  Nontoxic.  Pleasant and cooperative.   HENT:      Head: Normocephalic and atraumatic.      Right Ear: External ear normal.      Left Ear: External ear normal.      Nose: Nose normal.      Mouth/Throat:      Mouth: Mucous membranes are moist.   Eyes:      General:         Right eye: No discharge.         Left eye: No discharge.       Conjunctiva/sclera: Conjunctivae normal.   Cardiovascular:      Rate and Rhythm: Normal rate.   Pulmonary:      Effort: Pulmonary effort is normal. No respiratory distress.   Musculoskeletal:      Cervical back: Normal range of motion and neck supple.        Feet:       Comments: There is scaling in between the fourth and fifth toe on the left foot consistent with tinea pedis.  On the plantar aspect of the left lateral foot there is a small area of erythema and warmth with a central draining lesion consistent with cellulitis.  2+ dorsalis pedis pulse in left lower extremity.  No induration or fluctuance.  No soft tissue crepitus.  No lymphangitis.  Full range of motion of all joints of the left lower extremity.   Skin:     General: Skin is warm and dry.      Coloration: Skin is not jaundiced.   Neurological:      General: No focal deficit present.      Mental Status: She is alert. Mental status is at baseline.        Procedures     Procedures    No orders of the defined types were placed in this encounter.       Medications given during this emergency department visit:  Medications - No data to display    New Prescriptions    DOXYCYCLINE HYCLATE (VIBRA-TABS) 100 MG TABLET    Take 1 tablet by mouth 2 times daily for 7 days        History reviewed. No pertinent past medical history.     Past Surgical History:   Procedure Laterality Date    CESAREAN SECTION      HERNIA REPAIR      TUBAL LIGATION          Social History     Socioeconomic History    Marital status: Single     Spouse name: None    Number of children: None    Years of education: None    Highest education level: None   Tobacco Use    Smoking status: Every Day     Current packs/day: 0.25     Types: Cigarettes    Smokeless tobacco: Never   Substance and Sexual Activity  Alcohol use: Never     Social Determinants of Health     Financial Resource Strain: Low Risk  (12/17/2021)    Received from [facility], [facility]    Financial Resource Strain      Difficulty Paying Living Expenses: Not hard at all     Difficulty Paying Medical Expenses: No   Food Insecurity: No Food Insecurity (12/17/2021)    Received from [facility], [facility]    Food Insecurity     Worried about Running Out of Food in the Last Year: Never true     Ran Out of Food in the Last Year: Never true   Transportation Needs: No Transportation Needs (12/17/2021)    Received from [facility], [facility]    Transportation Needs     Lack of Transportation: No   Physical Activity: Inactive (12/17/2021)    Received from [facility], [facility]    Physical Activity     Days of Exercise per Week: 0     Minutes of Exercise per Session: 0     Total Minutes of Exercise per Week: 0   Stress: Stress Concern Present (12/17/2021)    Received from [facility], [facility]    Stress     Feeling of Stress : To some extent   Social Connections: Moderately Integrated (12/17/2021)    Received from [facility], [facility]    Social Connections     Frequency of Communication with Friends and Family: More than three times a week     Frequency of Social Gatherings with Friends and Family: Once a week   Intimate Partner Violence: Not At Risk (11/25/2022)    Received from [facility]    Intimate Partner Violence     Fear of Current or Ex-Partner: No     Emotionally Abused: No     Physically Abused: No     Sexually Abused: No   Housing Stability: Not At Risk (12/17/2021)    Received from [facility], [facility]    Housing Stability     Was there a time when you did not have a steady place to sleep: No     Worried that the place you are staying is making you sick: No        Previous Medications    HYOSCYAMINE (ANASPAZ;LEVSIN) 125 MCG TABLET    Take 1 tablet by mouth every 4 hours as needed for Cramping    ONDANSETRON (ZOFRAN) 4 MG TABLET    Take 1 tablet by mouth 3 times daily as needed for Nausea or Vomiting        No results found for any visits on 01/20/23.      No orders to display                No  results for input(s): "COVID19" in the last 72 hours.    Voice dictation software was used during the making of this note.  This software is not perfect and grammatical and other typographical errors may be present.  This note has not been completely proofread for errors.        Bailey Mech, [place]  01/20/23 1535

## 2023-01-26 DIAGNOSIS — T783XXA Angioneurotic edema, initial encounter: Secondary | ICD-10-CM

## 2023-01-26 NOTE — ED Notes (Signed)
Patient mobility status  with no difficulty. Provider aware     I have reviewed discharge instructions with the patient.  The patient verbalized understanding.    Patient left ED via Discharge Method: ambulatory to Home with  self.  Marland Kitchen    Opportunity for questions and clarification provided.     Patient given 1 scripts.

## 2023-01-26 NOTE — ED Provider Notes (Signed)
Emergency Department Provider Note       PCP: Janae Bridgeman, DO   Age: 37 y.o.   Sex: female     DISPOSITION Discharge - Pending Orders Complete 01/26/2023 10:20:43 PM    ICD-10-CM    1. Angioedema, initial encounter  T78.3XXA           Medical Decision Making     Discussed angioedema and risk factors.  Recommended follow-up with allergist.  As there is no evidence of airway compromise or impending airway compromise patient is stable for discharge and outpatient treatment     1 acute, uncomplicated illness or injury.  Prescription drug management performed.  Shared medical decision making was utilized in creating the patients health plan today.  I independently ordered and reviewed each unique test.                         History     Patient presents to the emerged part with complaints of swelling of the upper lip.  She states it seemed to begin after eating at a [employer] although she denies any known history of food allergies.  She denies prior similar occurrence although she states she had had some swelling of her lip associated with pimples on occasion.  There is no family history of angioedema.  Patient is not currently on any antihypertensive medication.  She is currently taking doxycycline for a foot infection.  She denies shortness of breath or difficulty swallowing and denies dental pain or infection.    The history is provided by the patient.     Physical Exam     Vitals signs and nursing note reviewed:  Vitals:    01/26/23 2125   BP: (!) 149/93   Pulse: 83   Resp: 16   Temp: 98.8 F (37.1 C)   TempSrc: Oral   SpO2: 98%   Weight: 115.7 kg (255 lb)   Height: 1.651 m (5\' 5" )      Physical Exam  Vitals and nursing note reviewed.   Constitutional:       General: She is not in acute distress.     Appearance: Normal appearance. She is not ill-appearing or toxic-appearing.   HENT:      Head: Normocephalic and atraumatic.      Mouth/Throat:      Mouth: Mucous membranes are moist.      Pharynx:  Oropharynx is clear. No oropharyngeal exudate or posterior oropharyngeal erythema.      Comments: No intraoral, lingual or parapharyngeal edema is noted.  There is swelling of the upper lip on the left side is in some mild swelling of the lower lip on the left side.  No evidence of trauma or infection.  Consistent with angioedema  Eyes:      Extraocular Movements: Extraocular movements intact.      Conjunctiva/sclera: Conjunctivae normal.      Pupils: Pupils are equal, round, and reactive to light.   Musculoskeletal:      Cervical back: Normal range of motion and neck supple. No rigidity or tenderness.   Lymphadenopathy:      Cervical: No cervical adenopathy.   Skin:     General: Skin is warm and dry.      Capillary Refill: Capillary refill takes less than 2 seconds.      Findings: No bruising or erythema.   Neurological:      General: No focal deficit present.      Mental Status:  She is alert and oriented to person, place, and time. Mental status is at baseline.   Psychiatric:         Mood and Affect: Mood normal.         Behavior: Behavior normal.         Thought Content: Thought content normal.        Procedures     Procedures    No orders of the defined types were placed in this encounter.       Medications given during this emergency department visit:     Medications   cetirizine (ZYRTEC) tablet 10 mg (has no administration in time range)   predniSONE (DELTASONE) tablet 50 mg (has no administration in time range)   famotidine (PEPCID) tablet 20 mg (has no administration in time range)       New Prescriptions    PREDNISONE (DELTASONE) 50 MG TABLET    Take 1 tablet by mouth daily for 5 days        No past medical history on file.     Past Surgical History:   Procedure Laterality Date    CESAREAN SECTION      HERNIA REPAIR      TUBAL LIGATION          Social History     Socioeconomic History    Marital status: Single   Tobacco Use    Smoking status: Every Day     Current packs/day: 0.25     Types: Cigarettes     Smokeless tobacco: Never   Substance and Sexual Activity    Alcohol use: Never     Social Determinants of Health     Financial Resource Strain: Low Risk  (12/17/2021)    Received from [facility], [facility]    Financial Resource Strain     Difficulty Paying Living Expenses: Not hard at all     Difficulty Paying Medical Expenses: No   Food Insecurity: No Food Insecurity (12/17/2021)    Received from [facility], [facility]    Food Insecurity     Worried about Running Out of Food in the Last Year: Never true     Ran Out of Food in the Last Year: Never true   Transportation Needs: No Transportation Needs (12/17/2021)    Received from [facility], [facility]    Transportation Needs     Lack of Transportation: No   Physical Activity: Inactive (12/17/2021)    Received from [facility], [facility]    Physical Activity     Days of Exercise per Week: 0     Minutes of Exercise per Session: 0     Total Minutes of Exercise per Week: 0   Stress: Stress Concern Present (12/17/2021)    Received from [facility], [facility]    Stress     Feeling of Stress : To some extent   Social Connections: Moderately Integrated (12/17/2021)    Received from [facility], [facility]    Social Connections     Frequency of Communication with Friends and Family: More than three times a week     Frequency of Social Gatherings with Friends and Family: Once a week   Intimate Partner Violence: Not At Risk (11/25/2022)    Received from [facility]    Intimate Partner Violence     Fear of Current or Ex-Partner: No     Emotionally Abused: No     Physically Abused: No     Sexually  Abused: No   Housing Stability: Not At Risk (12/17/2021)    Received from [facility], [facility]     Housing Stability     Was there a time when you did not have a steady place to sleep: No     Worried that the place you are staying is making you sick: No        Previous Medications    DOXYCYCLINE HYCLATE (VIBRA-TABS) 100 MG TABLET    Take 1 tablet by  mouth 2 times daily for 7 days    HYOSCYAMINE (ANASPAZ;LEVSIN) 125 MCG TABLET    Take 1 tablet by mouth every 4 hours as needed for Cramping    ONDANSETRON (ZOFRAN) 4 MG TABLET    Take 1 tablet by mouth 3 times daily as needed for Nausea or Vomiting        No results found for any visits on 01/26/23.      No orders to display                No results for input(s): "COVID19" in the last 72 hours.     Voice dictation software was used during the making of this note.  This software is not perfect and grammatical and other typographical errors may be present.  This note has not been completely proofread for errors.        Arna Medici, DO  01/26/23 2233

## 2023-01-26 NOTE — ED Triage Notes (Signed)
Patient ambulatory into ED w/steady gait.  Patient states that at dinner she bit into a chicken wing around 7pm and lip started to swelling.  No difficulty breathing. Patient did place a cool compress.  Patient states that she does not have any allergies that she is aware of.  Patient did not take anything for swelling due to be on a antibiotic for a toe infection.

## 2023-01-27 ENCOUNTER — Inpatient Hospital Stay
Admit: 2023-01-27 | Discharge: 2023-01-27 | Disposition: A | Discharge status: Home / Self Care | Payer: BLUE CROSS/BLUE SHIELD | Attending: Emergency Medicine

## 2023-01-27 MED ORDER — CETIRIZINE HCL 10 MG PO TABS
10 | ORAL | Status: AC
Start: 2023-01-27 — End: 2023-01-26
  Administered 2023-01-27: 03:00:00 10 mg via ORAL

## 2023-01-27 MED ORDER — PREDNISONE 50 MG PO TABS
50 | ORAL | Status: AC
Start: 2023-01-27 — End: 2023-01-26
  Administered 2023-01-27: 03:00:00 50 mg via ORAL

## 2023-01-27 MED ORDER — FAMOTIDINE 20 MG PO TABS
20 | ORAL | Status: AC
Start: 2023-01-27 — End: 2023-01-26
  Administered 2023-01-27: 03:00:00 20 mg via ORAL

## 2023-01-27 MED ORDER — PREDNISONE 50 MG PO TABS
50 | ORAL_TABLET | Freq: Every day | ORAL | 0 refills | Status: AC
Start: 2023-01-27 — End: 2023-01-31

## 2023-01-27 MED FILL — FAMOTIDINE 20 MG PO TABS: 20 MG | ORAL | Qty: 1

## 2023-01-27 MED FILL — PREDNISONE 50 MG PO TABS: 50 MG | ORAL | Qty: 1

## 2023-01-27 MED FILL — CETIRIZINE HCL 10 MG PO TABS: 10 MG | ORAL | Qty: 1

## 2023-02-10 LAB — HEMOGLOBIN A1C
Estimated Avg Glucose, External: 126 mg/dL
Hemoglobin A1C, External: 6 % — ABNORMAL HIGH (ref <5.7)

## 2023-02-10 NOTE — Progress Notes (Signed)
[facility] [address]  [address]  [place], [place] [address]  [phone] (f)   02/10/23     Barbara Erickson  DOB: May 06, 1985 MRN: [id number]    Subjective  History of Present Illness   Barbara Erickson is a 37 y.o.female who presents today for a follow up visit.  Chief Complaint   Patient presents with   . Annual Exam     Patient voices upcoming surgery for fibroids and desires preop workup with annual exam.        Patient moved from [facility]. Patient lives in [place] and she moved here for work. She lives with her daughter . She does not have any pets at home. 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   Diet: Breakfast bar, smoothie. Lunch grabbing something out. Dinner: Financial risk analyst or eating out. She does not eat a lot of fried foods. Meat: salmon, chicken, pork chops. She eats broccoli. Does not eat a lot of sweets  Exercise: None.    Active Medical problems  - Fibroids:  pending myomectomy.   - normocytic anemia:  last hgb 10.9. not on iron supplements    Today  Reported facial swelling s/p consumption of wings. Reported hives after received steroid medications. Patient reported previous allergic reaction 12 years ago.            PHQ Depression Screening     PHQ2 Score: 0    PHQ2/9 is interpreted as Negative in this patient         Current Outpatient Medications   Medication Sig Dispense Refill   . EPINEPHrine (EPIPEN) 0.3 mg/0.3 mL injection auto-injector Inject 0.3 mL (0.3 mg) into the muscle once as needed for anaphylaxis for up to 1 dose 2 each 0     No current facility-administered medications for this visit.       Immunization History   Administered Date(s) Administered   . Tdap 12/17/2021       Health Maintenance/Significant Tests  Total Chol: 12/17/2021  HGA1C: 12/29/2022  EKG: 12/17/2021  CXR: 10/13/2019  HIV Screen: 12/17/2021  Hep C Screen: 12/17/2021         Review of Systems     Review of Systems   Constitutional:  Negative for  chills and fever.   Eyes:  Negative for pain and visual disturbance.   Respiratory:  Negative for shortness of breath and wheezing.    Cardiovascular:  Negative for chest pain and leg swelling.   Gastrointestinal:  Negative for abdominal pain, diarrhea, nausea and vomiting.   Genitourinary:  Negative for dysuria and hematuria.   Musculoskeletal:  Negative for back pain and myalgias.   Skin:  Negative for rash and wound.   Neurological:  Negative for syncope and headaches.            Objective  Physical Exam   Vital Signs: BP 129/82 (BP Location: Left arm, Patient Position: Sitting)   Pulse 97   Temp 98.1 F (36.7 C) (Temporal)   Resp 20   Ht 165.1 cm (65")   Wt 114 kg (251 lb 1.6 oz)   LMP 01/21/2023 (Approximate)   SpO2 100%   BMI 41.79 kg/m     Wt Readings from Last 3 Encounters:   02/10/23 114 kg (251 lb 1.6 oz)   01/27/23 114 kg (252 lb 3.2 oz)   12/29/22 116 kg (255 lb)       BP Readings from Last 3 Encounters:  02/10/23 129/82   01/27/23 (!) 141/89   12/29/22 127/88         Physical Exam  Constitutional:       General: She is not in acute distress.     Appearance: She is well-developed.   HENT:      Head: Normocephalic and atraumatic.   Eyes:      Funduscopic exam:     Right eye: Red reflex present.         Left eye: Red reflex present.  Neck:      Thyroid: No thyroid mass, thyromegaly or thyroid tenderness.   Cardiovascular:      Rate and Rhythm: Normal rate and regular rhythm.      Heart sounds: No murmur heard.     No friction rub. No gallop.   Pulmonary:      Effort: Pulmonary effort is normal. No respiratory distress.      Breath sounds: Normal breath sounds. No wheezing or rales.   Abdominal:      General: Bowel sounds are normal. There is no distension.      Tenderness: There is no abdominal tenderness. There is no guarding.   Musculoskeletal:         General: Normal range of motion.      Right lower leg: No edema.      Left lower leg: No edema.   Skin:     General: Skin is warm and dry.    Neurological:      Mental Status: She is alert.   Psychiatric:         Behavior: Behavior normal.          Laboratory Studies     Lab Results   Component Value Date    WBC 7.9 12/29/2022    HGB 10.4 (L) 12/29/2022    HCT 34.0 12/29/2022    MCV 83 12/29/2022    PLT 417 12/29/2022       Lab Results   Component Value Date    K 3.7 12/29/2022    BUN 8 12/29/2022    CO2 21 12/29/2022    CREATININE 0.63 12/29/2022    ANIONGAP 12 12/17/2021    CL 104 12/29/2022    GLUCOSE 105 (H) 12/29/2022    CALCIUM 8.7 12/29/2022    NA 136 12/29/2022           No results found for: "VTD25"    Lab Results   Component Value Date    LDLCALC 114 12/17/2021    Lab Results   Component Value Date    CHOL 170 12/17/2021    HDL 42 (L) 12/17/2021    TRIG 69 12/17/2021       Lab Results   Component Value Date    HGBA1C 5.9 (H) 12/29/2022       Lab Results   Component Value Date    HGBA1C 5.9 (H) 12/29/2022    HGBA1C 5.9 (H) 12/17/2021       Lab Results   Component Value Date    TSH 1.080 12/29/2022       Lab Results   Component Value Date    ALBUMIN 3.4 (L) 12/17/2021    BILITOT 0.2 12/17/2021    ALKPHOS 68 12/17/2021    AST 20 12/17/2021    ALT 19 12/17/2021    PROT 7.3 12/17/2021                  Assessment and Plan   Barbara Erickson is a  37 y.o. female who presents to the office today for a follow up visit.    Routine medical exam  Assessment & Plan:  The following portions of the patient's history were reviewed and updated as appropriate: allergies, current medications, past family history, past medical history, past social history, past surgical history and problem list. Discussed age appropriate anticipatory guidance, maintaining safety, risk reduction, diet/exercise habits.  Also discussed benefits of health maintenance/annual physical exam, cancer screenings if applicable, and immunizations. Patient does not report any new symptoms today and she does not have any concerns at this time.  Routine lab work ordered for this patient at this  time.  Patient to follow-up with [place] for any new symptoms or any other concerns.  Otherwise next physical in 1 year.    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 Check A1C and liver panel.    Orders:  -     Hgb A1C (Glycohemoglobin); Future  -     Hgb A1C (Glycohemoglobin)    Preop examination  Assessment & Plan:  [facility] Activity Status Index: 58.2  Functional Capacity: high functional capacity  Medications: none at this time  Medical History: fibroids  Surgery type:   - High risk surgery:  intraabdominal     RCRI Risk factors:   1) Diabetes Mellitus tx with insulin (N)  2) History of heart failure (N)  3) Creatinine > 2 (N)  4) History of CVD (N)  5) High Risk Surgery (intrathoracic/peritoneal, suprainguinal vascular) (N)  6) History of MI or +exercise tolerance test, nitrate therapy, chest pain 2/2 CAD, EKG with pathologic Q waves (N)    Based on evaluation, patient has low preoperative risk for complications. EKG ordered at this time: sinus rhythm 93 bpm. No ST or t-wave elevation at this time. Basic lab work ordered.    Orders:  -     ECG 12 Lead; Future  -     ECG 12 Lead    Angioedema, subsequent encounter  Assessment & Plan:  New problem.  Patient is status post treatment for angioedema.   - EpiPen provided.  - Referral to allergist.    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  - will check iron panel and ferritin.  - advised ferrous sulfate supplementation every other  day with vitamin C.    Orders:  -     Transferrin w/ Iron, Calculated TIBC & % Sat; Future  -     Ferritin; Future  -     Ferritin  -     Transferrin w/ Iron, Calculated TIBC & % Sat           Return in about 1 year (around 02/10/2024) for In person, Preventive health.    Melissa Douge, DO 02/10/2023 4:19 PM  [facility]      Some aspects of this note were created using voice recognition software. Although efforts are made to correct meaningful transcription errors, grammatical and typographical errors may still exist. Please contact our office if there are any questions or concerns regarding the contents of this note.

## 2023-05-10 ENCOUNTER — Inpatient Hospital Stay
Admit: 2023-05-10 | Discharge: 2023-05-10 | Disposition: A | Discharge status: Home / Self Care | Payer: BLUE CROSS/BLUE SHIELD

## 2023-05-10 ENCOUNTER — Emergency Department
Admit: 2023-05-10 | Payer: BLUE CROSS/BLUE SHIELD | Primary: Student in an Organized Health Care Education/Training Program

## 2023-05-10 DIAGNOSIS — L509 Urticaria, unspecified: Secondary | ICD-10-CM

## 2023-05-10 DIAGNOSIS — M7989 Other specified soft tissue disorders: Secondary | ICD-10-CM

## 2023-05-10 MED ORDER — PREDNISONE 50 MG PO TABS
50 | ORAL_TABLET | Freq: Every day | ORAL | 0 refills | Status: AC
Start: 2023-05-10 — End: 2023-05-15

## 2023-05-10 MED ORDER — DEXAMETHASONE SODIUM PHOSPHATE 10 MG/ML IJ SOLN
10 | Freq: Once | INTRAMUSCULAR | Status: AC
Start: 2023-05-10 — End: 2023-05-10
  Administered 2023-05-10: 17:00:00 10 mg via INTRAMUSCULAR

## 2023-05-10 MED FILL — DEXAMETHASONE SODIUM PHOSPHATE 10 MG/ML IJ SOLN: 10 MG/ML | INTRAMUSCULAR | Qty: 1

## 2023-05-10 NOTE — Discharge Instructions (Signed)
Begin taking the steroids as prescribed.  You can take a daily Zyrtec, Claritin, or Allegra as well as Benadryl as needed.    Continue to closely monitor your symptoms at home.    If you do begin to experience any worsening swelling, redness, warmth, streaking, signs of infection, return to the ED immediately.

## 2023-05-10 NOTE — ED Triage Notes (Signed)
Patient verified by name and DOB prior to triage. Pt presents to the ER with steady gait.  Pt accompianed by self.  Pt and/or family reports right upper arm swelling that she noted upon waking. Pt with redness, swelling and warmth to right upper arm.  CMS intact.

## 2023-05-10 NOTE — ED Provider Notes (Signed)
Emergency Department Provider Note       PCP: Janae Bridgeman, DO   Age: 37 y.o.   Sex: female     DISPOSITION      ICD-10-CM    1. Swelling of right upper extremity  M79.89       2. Urticaria  L50.9           Medical Decision Making     Patient is a 38 year old female presenting with some right arm swelling, erythema, and pruritus beginning last night.  Patient states she noticed some swelling and redness to the right upper arm last night, she states she took a "allergy medicine" unsure what this was which slightly improved her symptoms.     On presentation, patient is afebrile, vital signs are stable, and she is well-appearing in no acute distress.  There is an area of erythema and slight warmth present to the medial right upper extremity, patient states it was more swollen and appeared to look like a hive-like lesion earlier today but has improved after taking the allergy medicine.  There is no tenderness to palpation, no induration, fluctuance, lesions, or any signs of any infection.  She has full range of motion in all of her joints and right upper extremity.  2+ palpable radial pulse present with brisk capillary refill, no pallor or coolness to the touch.    Do believe with her history of hives and allergic reactions this is likely the etiology of her symptoms, however will check a Doppler of the right upper extremity for thoroughness to check for any DVT.    Ultrasound is negative for any evidence of DVT.  As she has no sign of DVT, arterial compromise, septic joint or infection, or any other emergent pathologies plan for this patient is continued outpatient management.    Will give her a shot of Decadron in facility and send her home with a course of prednisone to continue taking.  I advised her to begin taking a daily Zyrtec, Claritin, or Allegra as well as taking Benadryl as needed.  She will continue to closely monitor her symptoms at home.  She was given strict return precautions such as but not  limited to pallor, coolness to the touch, fever, streaking, signs of infection, excetra.  She verbalized understanding with plan of care and left facility in stable condition.     1 acute complicated illness or injury.  Prescription drug management performed.  Shared medical decision making was utilized in creating the patients health plan today.  I independently ordered and reviewed each unique test.    I reviewed external records: ED visit note from a different ED.   I reviewed external records: provider visit note from PCP.  I reviewed external records: provider visit note from outside specialist.                     History     Patient is a 38 year old female presenting with some right arm swelling, erythema, and pruritus beginning last night.  Patient states she noticed some swelling and redness to the right upper arm last night, she states she took a "allergy medicine" unsure what this was which slightly improved her symptoms.  She states it is no longer itching.  She denies any new lotions, soaps, or detergents.  She does have a history of eczema but denies any lesions in this area.  She denies any fevers.  Denies any injury or trauma.  Denies any open wounds or  sores.  Denies any pallor or coolness to the touch.  She denies any chest pain or shortness of breath.  Denies any history of any DVT or PE.  Patient is the primary historian and the quality of the history appears reliable.    The history is provided by the patient. No language interpreter was used.     Physical Exam     Vitals signs and nursing note reviewed:  Vitals:    05/10/23 1022 05/10/23 1023 05/10/23 1207   BP:  128/82 122/78   Pulse:  94 86   Resp:  17 18   Temp:  98.6 F (37 C)    TempSrc:  Oral    SpO2:  98% 98%   Weight: 113.4 kg (250 lb)     Height: 1.651 m (5\' 5" )        Physical Exam  Constitutional:       General: She is not in acute distress.     Appearance: Normal appearance. She is not ill-appearing, toxic-appearing or diaphoretic.    HENT:      Head: Normocephalic and atraumatic.      Right Ear: External ear normal.      Left Ear: External ear normal.      Nose: Nose normal.      Mouth/Throat:      Pharynx: Oropharynx is clear.      Comments: Oropharynx is patent, no sign of any edema or angioedema  Eyes:      Conjunctiva/sclera: Conjunctivae normal.   Cardiovascular:      Rate and Rhythm: Normal rate and regular rhythm.   Pulmonary:      Effort: Pulmonary effort is normal. No respiratory distress.      Comments: Speaking in full sentences with no evidence of respiratory distress.  Musculoskeletal:        Arms:       Comments: Light area of erythema present to the right upper extremity with some scattered hive-like lesions.  Does extend down onto the forearm but is difficult to assess due to patient's tattoo.  She has full range of motion in the elbow, shoulder, wrist, and hand, no sign of any septic joint.  2+ palpable radial pulse with brisk capillary refill, no pallor or coolness to the touch.  No open wounds or sores.  No induration, fluctuance, cellulitic streaking, or any signs of infection.   Neurological:      Mental Status: She is alert and oriented to person, place, and time.   Psychiatric:         Mood and Affect: Mood normal.         Behavior: Behavior normal.          Procedures     Procedures    Orders placed during this emergency department visit:     Orders Placed This Encounter   Procedures    Vascular duplex upper extremity venous right        Medications given during this emergency department visit:     Medications   dexAMETHasone (DECADRON) injection 10 mg (10 mg IntraMUSCular Given 05/10/23 1204)       New prescriptions:     New Prescriptions    No medications on file        Past History and Complexity:     No past medical history on file.     Past Surgical History:   Procedure Laterality Date    CESAREAN SECTION      HERNIA REPAIR  TUBAL LIGATION          Social History     Socioeconomic History    Marital status:  Single   Tobacco Use    Smoking status: Every Day     Current packs/day: 0.25     Types: Cigarettes    Smokeless tobacco: Never   Substance and Sexual Activity    Alcohol use: Never     Social Determinants of Health     Financial Resource Strain: Low Risk  (12/17/2021)    Received from [facility], [facility]    Financial Resource Strain     Difficulty Paying Living Expenses: Not hard at all     Difficulty Paying Medical Expenses: No   Food Insecurity: No Food Insecurity (12/17/2021)    Received from [facility], [facility]    Food Insecurity     Worried about Running Out of Food in the Last Year: Never true     Ran Out of Food in the Last Year: Never true   Transportation Needs: No Transportation Needs (12/17/2021)    Received from [facility], [facility]    Transportation Needs     Lack of Transportation: No   Physical Activity: Inactive (12/17/2021)    Received from [facility], [facility]    Physical Activity     Days of Exercise per Week: 0     Minutes of Exercise per Session: 0     Total Minutes of Exercise per Week: 0   Stress: Stress Concern Present (12/17/2021)    Received from [facility], [facility]    Stress     Feeling of Stress : To some extent   Social Connections: Moderately Integrated (12/17/2021)    Received from [facility], [facility]    Social Connections     Frequency of Communication with Friends and Family: More than three times a week     Frequency of Social Gatherings with Friends and Family: Once a week   Intimate Partner Violence: Not At Risk (11/25/2022)    Received from [facility]    Intimate Partner Violence     Fear of Current or Ex-Partner: No     Emotionally Abused: No     Physically Abused: No     Sexually Abused: No   Housing Stability: Not At Risk (12/17/2021)    Received from [facility], [facility]    Housing Stability     Was there a time when you did not have a steady place to sleep: No     Worried that the place you are staying is making you  sick: No        Previous Medications    HYOSCYAMINE (ANASPAZ;LEVSIN) 125 MCG TABLET    Take 1 tablet by mouth every 4 hours as needed for Cramping    ONDANSETRON (ZOFRAN) 4 MG TABLET    Take 1 tablet by mouth 3 times daily as needed for Nausea or Vomiting        Results from this emergency department visit:      Results for orders placed or performed during the hospital encounter of 05/10/23   Vascular duplex upper extremity venous right    Narrative    Right upper extremity venous ultrasound    INDICATION:  Pain right entire arm; erythema     COMPARISON:  None    FINDINGS: Doppler ultrasound of the right upper extremity shows normal flow in  the subclavian, axillary, and basilic veins. The cephalic vein,  brachial vein,  and forearm veins are also patent. There is normal flow in the right internal  jugular vein.      Impression    No DVT or superficial venous thrombus in the right arm.      Electronically signed by Elsie Stain         Vascular duplex upper extremity venous right   Final Result   No DVT or superficial venous thrombus in the right arm.         Electronically signed by Elsie Stain                   No results for input(s): "COVID19" in the last 72 hours.     Voice dictation software was used during the making of this note.  This software is not perfect and grammatical and other typographical errors may be present.  This note has not been completely proofread for errors.     Vanessa Durham, PA-C  05/10/23 1250

## 2023-11-16 ENCOUNTER — Inpatient Hospital Stay
Admit: 2023-11-16 | Discharge: 2023-11-16 | Disposition: A | Discharge status: Home / Self Care | Payer: BLUE CROSS/BLUE SHIELD | Arrived: VH | Attending: Emergency Medicine

## 2023-11-16 DIAGNOSIS — J069 Acute upper respiratory infection, unspecified: Principal | ICD-10-CM

## 2023-11-16 LAB — COVID-19 & INFLUENZA COMBO
Influenza A, NAA: NOT DETECTED
Influenza B, NAA: NOT DETECTED
SARS-CoV-2, Rapid: NOT DETECTED

## 2023-11-16 MED ORDER — OXYMETAZOLINE HCL 0.05 % NA SOLN
0.05 | Freq: Two times a day (BID) | NASAL | 0 refills | Status: AC
Start: 2023-11-16 — End: 2023-12-16

## 2023-11-16 MED ORDER — LORATADINE 10 MG PO TABS
10 | ORAL_TABLET | Freq: Every day | ORAL | 0 refills | 30.00000 days | Status: AC
Start: 2023-11-16 — End: 2023-12-16

## 2023-11-16 NOTE — ED Triage Notes (Signed)
 Pt to the ED from home with c/o of head cold symptoms since Sunday. Pt states that she has had body chills, tension headache, and stuffy nose. No fevers. No cough. No SOB.

## 2023-11-16 NOTE — Discharge Instructions (Addendum)
 You are being treated for upper respiratory virus  Your body will fight off the infection with time  We are giving you medications to help with your symptom  Drink plenty of fluids  Return to the ER for any new, worsening life-threatening symptom

## 2023-11-16 NOTE — ED Notes (Signed)
 Patient mobility status ambulates with no difficulty.     I have reviewed discharge instructions with the patient.  The patient verbalized understanding.    Patient left ED via Discharge Method: ambulatory to Home with  self .    Opportunity for questions and clarification provided.     Patient given 2 scripts.

## 2023-11-16 NOTE — ED Provider Notes (Signed)
 Emergency Department Provider Note       [facility] EMERGENCY DEPT   PCP: Marcy Setter, DO   Age: 38 y.o.   Sex: female     DISPOSITION Decision To Discharge 11/16/2023 08:34:07 AM    ICD-10-CM    1. Acute upper respiratory infection  J06.9           Medical Decision Making     Not hypoxic here.  Will swab for COVID and flu.  Will discuss further outpatient treatment for URI symptoms    8:35 AM EDT  COVID and flu swabs are negative.  Will discharge home.  Given viral URI precautions     1 acute, uncomplicated illness or injury.  Over the counter drug management performed.  Prescription drug management performed.  Chronic medical problems impacting care include obesity.  Shared medical decision making was utilized in creating the patients health plan today.  I independently ordered and reviewed each unique test.    I reviewed external records: ED visit note from a different ED.   I reviewed external records: provider visit note from PCP.  I reviewed external records: provider visit note from outside specialist.  I reviewed external records: previous lab results from outside ED.  I reviewed external records: previous imaging study including radiologist interpretation.                     History     Patient presents to the ER with acute onset of rhinorrhea, nasal congestion chills and fatigue.  Patient states symptoms started about 3 days ago.  Denies any vomiting.  Denies any significant cough or shortness of breath.  Denies any neck pain or neck stiffness.  Reports possible sick contacts at work    The history is provided by the patient.   Cold Symptoms  Presenting symptoms: congestion, fatigue and rhinorrhea    Congestion:     Location:  Nasal    Interferes with sleep: no      Interferes with eating/drinking: no    Severity:  Moderate  Onset quality:  Gradual  Duration:  3 days  Worsened by:  Nothing  Associated symptoms: myalgias    Associated symptoms: no headaches    Risk factors: not elderly, no chronic cardiac  disease, no chronic kidney disease, no chronic respiratory disease, no diabetes mellitus, no immunosuppression and no recent illness        ROS     Review of Systems   Constitutional:  Positive for fatigue.   HENT:  Positive for congestion and rhinorrhea. Negative for trouble swallowing and voice change.    Eyes:  Negative for photophobia, redness and visual disturbance.   Respiratory:  Negative for choking and chest tightness.    Cardiovascular:  Negative for chest pain and leg swelling.   Gastrointestinal:  Negative for abdominal pain, nausea and vomiting.   Endocrine: Negative for polyphagia and polyuria.   Genitourinary:  Negative for pelvic pain and urgency.   Musculoskeletal:  Positive for myalgias. Negative for neck stiffness.   Skin:  Negative for color change and pallor.   Neurological:  Negative for tremors, weakness and headaches.   Hematological:  Negative for adenopathy. Does not bruise/bleed easily.   Psychiatric/Behavioral:  Negative for behavioral problems and confusion.    All other systems reviewed and are negative.       Physical Exam     Vitals signs and nursing note reviewed:  Vitals:    11/16/23 0755   BP: 130/80  Pulse: 94   Resp: 18   Temp: 98 F (36.7 C)   TempSrc: Oral   SpO2: 99%   Weight: 115.9 kg (255 lb 8.2 oz)   Height: 1.651 m (5' 5)      Physical Exam  Vitals and nursing note reviewed.   Constitutional:       Appearance: Normal appearance.   HENT:      Head: Normocephalic and atraumatic.      Right Ear: External ear normal.      Left Ear: External ear normal.      Nose: Congestion and rhinorrhea present.   Eyes:      General:         Right eye: No discharge.         Left eye: No discharge.      Extraocular Movements: Extraocular movements intact.      Pupils: Pupils are equal, round, and reactive to light.   Cardiovascular:      Rate and Rhythm: Normal rate and regular rhythm.      Pulses: Normal pulses.      Heart sounds: Normal heart sounds.   Pulmonary:      Effort: Pulmonary  effort is normal.      Breath sounds: Normal breath sounds.   Abdominal:      General: Abdomen is flat. There is no distension.      Palpations: There is no mass.   Musculoskeletal:         General: No swelling, tenderness, deformity or signs of injury.   Skin:     Coloration: Skin is not jaundiced or pale.      Findings: No bruising.   Neurological:      General: No focal deficit present.      Mental Status: She is alert and oriented to person, place, and time.      Cranial Nerves: No cranial nerve deficit.      Sensory: No sensory deficit.      Motor: No weakness.      Coordination: Coordination normal.        Procedures     Procedures    Orders placed during this emergency department visit:     Orders Placed This Encounter   Procedures    COVID-19 & Influenza Combo        Medications given during this emergency department visit:   Medications - No data to display    New prescriptions:     New Prescriptions    LORATADINE (CLARITIN) 10 MG TABLET    Take 1 tablet by mouth daily    OXYMETAZOLINE (12 HOUR NASAL SPRAY) 0.05 % NASAL SPRAY    2 sprays by Nasal route 2 times daily        Past History and Complexity:     No past medical history on file.     Past Surgical History:   Procedure Laterality Date    CESAREAN SECTION      HERNIA REPAIR      TUBAL LIGATION          Social History     Socioeconomic History    Marital status: Single   Tobacco Use    Smoking status: Every Day     Current packs/day: 0.25     Types: Cigarettes    Smokeless tobacco: Never   Substance and Sexual Activity    Alcohol use: Yes     Alcohol/week: 0.0 - 1.0 standard drinks of alcohol  Social Drivers of Psychologist, prison and probation services Strain: Low Risk  (06/16/2023)    Received from [facility]    Financial Resource Strain     Difficulty Paying Living Expenses: Not hard at all     Difficulty Paying Medical Expenses: No   Food Insecurity: No Food Insecurity (06/16/2023)    Received from [facility]    Food Insecurity     Worried about Running  Out of Food in the Last Year: Never true     Ran Out of Food in the Last Year: Never true   Transportation Needs: No Transportation Needs (06/16/2023)    Received from [facility] Needs     Lack of Transportation: No   Physical Activity: Inactive (10/11/2023)    Received from [facility]    Physical Activity     Days of Exercise per Week: 0 days     On average, how many minutes do you exercise per day?: 0     Total Minutes of Exercise Per Week: 0   Stress: Stress Concern Present (06/16/2023)    Received from [facility]    Stress     Feeling of Stress : To some extent   Social Connections: Moderately Integrated (06/16/2023)    Received from [facility]    Social Connections     Frequency of Communication with Friends and Family: More than three times a week     Frequency of Social Gatherings with Friends and Family: Once a week   Intimate Partner Violence: Not At Risk (06/16/2023)    Received from [facility]    Intimate Partner Violence     Fear of Current or Ex-Partner: No     Emotionally Abused: No     Physically Abused: No     Sexually Abused: No   Housing Stability: Not At Risk (06/16/2023)    Received from [facility] Stability     Was there a time when you did not have a steady place to sleep: No     Worried that the place you are staying is making you sick: No        Previous Medications    HYOSCYAMINE  (ANASPAZ ;LEVSIN ) 125 MCG TABLET    Take 1 tablet by mouth every 4 hours as needed for Cramping    ONDANSETRON  (ZOFRAN ) 4 MG TABLET    Take 1 tablet by mouth 3 times daily as needed for Nausea or Vomiting        Results from this emergency department visit:      Results for orders placed or performed during the hospital encounter of 11/16/23   COVID-19 & Influenza Combo    Specimen: Swab   Result Value Ref Range    Source NASAL      SARS-CoV-2, Rapid Not detected NOTD      Influenza A, NAA Not detected NOTD      Influenza B, NAA Not detected NOTD           No orders to display                 Recent Labs     11/16/23  0759   COVID19 Not detected        Voice dictation software was used during the making of this note.  This software is not perfect and grammatical and other typographical errors may be present.  This note has not been completely proofread for errors.  Delores Bernardino KIDD, MD  11/16/23 [phone]

## 2024-02-10 ENCOUNTER — Inpatient Hospital Stay
Admit: 2024-02-10 | Discharge: 2024-02-11 | Disposition: A | Discharge status: Home / Self Care | Payer: BLUE CROSS/BLUE SHIELD | Arrived: VH

## 2024-02-10 DIAGNOSIS — J029 Acute pharyngitis, unspecified: Secondary | ICD-10-CM

## 2024-02-10 LAB — GROUP A STREP SCREEN BY PCR: Strep, Molecular: NOT DETECTED

## 2024-02-10 MED ORDER — PREDNISONE 50 MG PO TABS
50 | Freq: Once | ORAL | Status: AC
Start: 2024-02-10 — End: 2024-02-10
  Administered 2024-02-10: 60 mg via ORAL

## 2024-02-10 MED FILL — PREDNISONE 10 MG PO TABS: 10 mg | ORAL | Qty: 1 | Fill #0

## 2024-02-10 NOTE — Discharge Instructions (Signed)
"  Both COVID, flu and strep swabs have all been negative.  I suspect that this is a viral pharyngitis we have been seeing in the community.  Use the steroids as directed.  Continue Tylenol  and ibuprofen at home.  Follow-up with your primary care doctor.  "

## 2024-02-10 NOTE — ED Provider Notes (Signed)
 "    Emergency Department Provider Note       PCP: Marcy Setter, DO   Age: 38 y.o.   Sex: female     DISPOSITION Decision To Discharge 02/10/2024 07:07:14 PM   DISPOSITION CONDITION Stable            ICD-10-CM    1. Viral pharyngitis  J02.9         Medical Decision Making     Viral-like syndrome.  Cough, sore throat 2 days ago.  Viral swabs and strep swab negative here.  Will discharge home with instruction to continue symptomatic treatment and taking her steroids as directed.     1 acute complicated illness or injury.  Over the counter drug management performed.  Shared medical decision making was utilized in creating the patients health plan today.    I independently ordered and reviewed each unique test.    History     38 year old female.  Presents here with complaint of sore throat.  Reports having strep in the past.  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  Her symptoms seem to be worsening upon their onset 2 days ago.  Having some cold chills as well.  Not having any nausea or vomiting or cough.  Does have mild runny nose.    The history is provided by the patient. No language interpreter was used.     Physical Exam     Vitals signs and nursing note reviewed:  Vitals:    02/10/24 1802 02/10/24 1806   BP: (!) 157/103    Pulse: (!) 101    Resp: 18    Temp: 99.5 F (37.5 C)    SpO2: 100%    Weight:  112.3 kg (247 lb 9.2 oz)   Height:  1.651 m (5' 5)      Physical Exam  Vitals and nursing note reviewed.   Constitutional:       General: She is not in acute distress.     Appearance: Normal appearance. She is not toxic-appearing.   HENT:      Head: Normocephalic and atraumatic.      Nose: Congestion and rhinorrhea present.      Mouth/Throat:      Tonsils: 1+ on the right. 2+ on the left.   Cardiovascular:      Rate and Rhythm: Normal rate.   Pulmonary:      Effort: Pulmonary effort is normal. No respiratory distress.   Musculoskeletal:          General: Normal range of motion.   Neurological:      General: No focal deficit present.      Mental Status: She is alert and oriented to person, place, and time.   Psychiatric:         Behavior: Behavior normal.        Procedures     Procedures    Orders Placed This Encounter   Procedures    Group A Strep Screen By PCR    COVID-19 & Influenza Combo        Medications given during this emergency department visit:  Medications   predniSONE  (DELTASONE ) tablet 60 mg (60 mg Oral Given 02/10/24 1845)       Current Discharge Medication List        START taking these medications    Details   predniSONE  (DELTASONE ) 20 MG tablet Take 3 tablets by mouth daily  for 4 days  Qty: 12 tablet, Refills: 0              No past medical history on file.     Past Surgical History:   Procedure Laterality Date    CESAREAN SECTION      HERNIA REPAIR      TUBAL LIGATION          Social History     Socioeconomic History    Marital status: Single   Tobacco Use    Smoking status: Every Day     Current packs/day: 0.25     Types: Cigarettes    Smokeless tobacco: Never   Substance and Sexual Activity    Alcohol use: Not Currently     Social Drivers of Health     Financial Resource Strain: Low Risk  (06/16/2023)    Received from [facility]    Financial Resource Strain     Difficulty Paying Living Expenses: Not hard at all     Difficulty Paying Medical Expenses: No   Food Insecurity: No Food Insecurity (06/16/2023)    Received from [facility]    Food Insecurity     Worried about Running Out of Food in the Last Year: Never true     Ran Out of Food in the Last Year: Never true   Transportation Needs: No Transportation Needs (06/16/2023)    Received from [facility]    Transportation Needs     Lack of Transportation: No   Physical Activity: Inactive (10/11/2023)    Received from [facility]    Physical Activity     Days of Exercise per Week: 0 days     On those days that you engage in moderate to vigorous physical activity, how many minutes,  on average, do you exercise?: 0     Total Minutes of Exercise Per Week: 0   Stress: Stress Concern Present (06/16/2023)    Received from [facility]    Stress     Feeling of Stress : To some extent   Social Connections: Moderately Integrated (06/16/2023)    Received from [facility]    Social Connections     Frequency of Communication with Friends and Family: More than three times a week     Frequency of Social Gatherings with Friends and Family: Once a week   Intimate Partner Violence: Not At Risk (06/16/2023)    Received from [facility]    Intimate Partner Violence     Fear of Current or Ex-Partner: No     Emotionally Abused: No     Physically Abused: No     Sexually Abused: No   Housing Stability: Not At Risk (06/16/2023)    Received from [facility] Stability     Was there a time when you did not have a steady place to sleep: No     Worried that the place you are staying is making you sick: No        Current Discharge Medication List        CONTINUE these medications which have NOT CHANGED    Details   ondansetron  (ZOFRAN ) 4 MG tablet Take 1 tablet by mouth 3 times daily as needed for Nausea or Vomiting  Qty: 15 tablet, Refills: 0      hyoscyamine  (ANASPAZ ;LEVSIN ) 125 MCG tablet Take 1 tablet by mouth every 4 hours as needed for Cramping  Qty: 20 tablet, Refills: 0  Results for orders placed or performed during the hospital encounter of 02/10/24   Group A Strep Screen By PCR    Specimen: Swab   Result Value Ref Range    Strep, Molecular Not detected NOTD     COVID-19 & Influenza Combo    Specimen: Swab   Result Value Ref Range    Source NASAL      SARS-CoV-2, Rapid Not detected NOTD      Influenza A, NAA Not detected NOTD      Influenza B, NAA Not detected NOTD           No orders to display                Recent Labs     02/10/24  1835   COVID19 Not detected       Voice dictation software was used during the making of this note.  This software is not perfect and grammatical and other  typographical errors may be present.  This note has not been completely proofread for errors.     Linnette Sharper, [place]  02/10/24 1907    "

## 2024-02-10 NOTE — ED Notes (Signed)
"  Patient mobility status ambulates with no difficulty.     I have reviewed discharge instructions with the patient.  The patient verbalized understanding.    Patient left ED via Discharge Method: ambulatory to Home with self.    Opportunity for questions and clarification provided.     Patient given 1 scripts.         "

## 2024-02-10 NOTE — ED Triage Notes (Signed)
"  Ambulatory to triage, gait steady. Pt c/o pharyngitis, onset 2 days ago. Pt states she is prone to strep. Denies cough, NV, fever.   "

## 2024-02-11 LAB — COVID-19 & INFLUENZA COMBO
Influenza A, NAA: NOT DETECTED
Influenza B, NAA: NOT DETECTED
SARS-CoV-2, Rapid: NOT DETECTED

## 2024-02-11 MED ORDER — PREDNISONE 20 MG PO TABS
20 | ORAL_TABLET | Freq: Every day | ORAL | 0 refills | 5.00000 days | Status: AC
Start: 2024-02-11 — End: 2024-02-14
